# Patient Record
Sex: Male | Born: 1995 | Race: White | Hispanic: No | Marital: Single | Smoking: Current every day smoker
Health system: Southern US, Community
[De-identification: ages and names within clinical notes are randomized; demographics above are authoritative.]

## PROBLEM LIST (undated history)

## (undated) DIAGNOSIS — Z8709 Personal history of other diseases of the respiratory system: Secondary | ICD-10-CM

## (undated) DIAGNOSIS — F32A Depression, unspecified: Secondary | ICD-10-CM

## (undated) DIAGNOSIS — L511 Stevens-Johnson syndrome: Secondary | ICD-10-CM

## (undated) DIAGNOSIS — F419 Anxiety disorder, unspecified: Secondary | ICD-10-CM

## (undated) DIAGNOSIS — F329 Major depressive disorder, single episode, unspecified: Secondary | ICD-10-CM

## (undated) DIAGNOSIS — S83511A Sprain of anterior cruciate ligament of right knee, initial encounter: Secondary | ICD-10-CM

## (undated) DIAGNOSIS — R569 Unspecified convulsions: Secondary | ICD-10-CM

## (undated) HISTORY — PX: WISDOM TOOTH EXTRACTION: SHX21

## (undated) HISTORY — PX: DENTAL SURGERY: SHX609

## (undated) HISTORY — PX: SHOULDER SURGERY: SHX246

---
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
## 2013-05-15 ENCOUNTER — Emergency Department (HOSPITAL_COMMUNITY)
Admission: EM | Admit: 2013-05-15 | Discharge: 2013-05-15 | Disposition: A | Discharge status: Home / Self Care | Payer: BC Managed Care – PPO | Attending: Emergency Medicine | Admitting: Emergency Medicine

## 2013-05-15 ENCOUNTER — Encounter (HOSPITAL_COMMUNITY): Payer: Self-pay | Admitting: Emergency Medicine

## 2013-05-15 DIAGNOSIS — Z79899 Other long term (current) drug therapy: Secondary | ICD-10-CM | POA: Insufficient documentation

## 2013-05-15 DIAGNOSIS — G40909 Epilepsy, unspecified, not intractable, without status epilepticus: Secondary | ICD-10-CM | POA: Insufficient documentation

## 2013-05-15 DIAGNOSIS — F17200 Nicotine dependence, unspecified, uncomplicated: Secondary | ICD-10-CM | POA: Insufficient documentation

## 2013-05-15 DIAGNOSIS — R569 Unspecified convulsions: Secondary | ICD-10-CM

## 2013-05-15 LAB — CBC WITH DIFFERENTIAL/PLATELET
Basophils Relative: 0 % (ref 0–1)
Eosinophils Absolute: 0.4 10*3/uL (ref 0.0–0.7)
Eosinophils Relative: 6 % — ABNORMAL HIGH (ref 0–5)
Hemoglobin: 14.9 g/dL (ref 13.0–17.0)
Lymphs Abs: 2.4 10*3/uL (ref 0.7–4.0)
MCH: 30.9 pg (ref 26.0–34.0)
MCHC: 35.8 g/dL (ref 30.0–36.0)
MCV: 86.3 fL (ref 78.0–100.0)
Monocytes Relative: 12 % (ref 3–12)
Platelets: 206 10*3/uL (ref 150–400)
RBC: 4.82 MIL/uL (ref 4.22–5.81)

## 2013-05-15 LAB — GLUCOSE, CAPILLARY: Glucose-Capillary: 84 mg/dL (ref 70–99)

## 2013-05-15 LAB — POCT I-STAT, CHEM 8
Calcium, Ion: 1.19 mmol/L (ref 1.12–1.23)
Glucose, Bld: 89 mg/dL (ref 70–99)
HCT: 45 % (ref 39.0–52.0)
Hemoglobin: 15.3 g/dL (ref 13.0–17.0)
Potassium: 3.9 mEq/L (ref 3.5–5.1)
TCO2: 25 mmol/L (ref 0–100)

## 2013-05-15 NOTE — ED Provider Notes (Signed)
CSN: [id number]     Arrival date & time 05/15/13  0345 History   First MD Initiated Contact with Patient 05/15/13 0405     Chief Complaint  Patient presents with  . Seizures   (Consider location/radiation/quality/duration/timing/severity/associated sxs/prior Treatment) HPI Is a 17 year old male with a history of seizures, treated with Dilantin by Dr. Anne Hahn locally. He was admitted to [facility] on November 29 after having "60 seizures". I reviewed his admission records indicate that an EEG was performed which was negative. He also had a prolactin level that was within normal range. The differential diagnosis was seizures versus pseudoseizures. He was discharged October 4.   She is here this morning after his girlfriend witnessed a series of seizures lasting about 30 minutes. His seizures usually last about a minute and occur back-to-back. His parents state he had 5 seizures in the car on the way here. There was no incontinence of urine he did not bite his tongue. He complains of pain in his head and in his left shoulder which is common after his seizures. He also has some cramping in his legs. He denies nausea or vomiting.  His parents relate, in private to me, that the seizures he has had in the past have typically been followed by prolonged post ictal states. The seizures he had the occasion to his admission to [facility] had no postictal states and hence the concern that they may represent pseudoseizures. His parents note that he was not admitted to the neurology unit at [facility] but rather to the psychiatry unit after becoming agitated and threatening violence in the ED.  Past Medical History  Diagnosis Date  . Seizures    Past Surgical History  Procedure Laterality Date  . Shoulder surgery    . Wisdom tooth extraction     History reviewed. No pertinent family history. History  Substance Use Topics  . Smoking status: Current Every Day Smoker -- 1.00 packs/day    Types:  Cigarettes  . Smokeless tobacco: Not on file  . Alcohol Use: Yes     Comment: occasionally    Review of Systems  All other systems reviewed and are negative.    Allergies  Biaxin and Sulfa antibiotics  Home Medications   Current Outpatient Rx  Name  Route  Sig  Dispense  Refill  . phenytoin (DILANTIN) 100 MG ER capsule      TAKE 3 CAPSULES BY MOUTH DAILY AT BEDTIME   90 capsule   6     Please Schedule Appt   . phenytoin (DILANTIN) 300 MG ER capsule   Oral   Take 300 mg by mouth at bedtime.          BP 119/77  Pulse 64  Resp 16  SpO2 98%  Physical Exam General: Well-developed, well-nourished male in no acute distress; appearance consistent with age of record HENT: normocephalic; atraumatic Eyes: pupils equal, round and reactive to light; extraocular muscles intact Neck: supple Heart: regular rate and rhythm Lungs: clear to auscultation bilaterally Abdomen: soft; nondistended; nontender; bowel sounds present Extremities: No deformity; full range of motion; pulses normal Neurologic: Awake, alert and oriented; motor function intact in all extremities and symmetric; no facial droop; negative Romberg; normal finger to nose; normal coordination speech Skin: Warm and dry Psychiatric: Flat affect    ED Course  Procedures (including critical care time)    MDM   Nursing notes and vitals signs, including pulse oximetry, reviewed.  Summary of this visit's results, reviewed by myself:  Labs:  Results for orders placed during the hospital encounter of 05/15/13 (from the past 24 hour(s))  POCT I-STAT, CHEM 8     Status: None   Collection Time    05/15/13  4:38 AM      Result Value Range   Sodium 140  135 - 145 mEq/L   Potassium 3.9  3.5 - 5.1 mEq/L   Chloride 103  96 - 112 mEq/L   BUN 12  6 - 23 mg/dL   Creatinine, Ser 4.09  0.50 - 1.35 mg/dL   Glucose, Bld 89  70 - 99 mg/dL   Calcium, Ion 8.11  9.14 - 1.23 mmol/L   TCO2 25  0 - 100 mmol/L   Hemoglobin  15.3  13.0 - 17.0 g/dL   HCT 78.2  95.6 - 21.3 %  PHENYTOIN LEVEL, TOTAL     Status: None   Collection Time    05/15/13  4:40 AM      Result Value Range   Phenytoin Lvl 11.2  10.0 - 20.0 ug/mL  CBC WITH DIFFERENTIAL     Status: Abnormal   Collection Time    05/15/13  4:40 AM      Result Value Range   WBC 6.8  4.0 - 10.5 K/uL   RBC 4.82  4.22 - 5.81 MIL/uL   Hemoglobin 14.9  13.0 - 17.0 g/dL   HCT 08.6  57.8 - 46.9 %   MCV 86.3  78.0 - 100.0 fL   MCH 30.9  26.0 - 34.0 pg   MCHC 35.8  30.0 - 36.0 g/dL   RDW 62.9  52.8 - 41.3 %   Platelets 206  150 - 400 K/uL   Neutrophils Relative % 47  43 - 77 %   Neutro Abs 3.2  1.7 - 7.7 K/uL   Lymphocytes Relative 35  12 - 46 %   Lymphs Abs 2.4  0.7 - 4.0 K/uL   Monocytes Relative 12  3 - 12 %   Monocytes Absolute 0.8  0.1 - 1.0 K/uL   Eosinophils Relative 6 (*) 0 - 5 %   Eosinophils Absolute 0.4  0.0 - 0.7 K/uL   Basophils Relative 0  0 - 1 %   Basophils Absolute 0.0  0.0 - 0.1 K/uL  GLUCOSE, CAPILLARY     Status: None   Collection Time    05/15/13  5:50 AM      Result Value Range   Glucose-Capillary 84  70 - 99 mg/dL   2:44 AM The patient's Dilantin level is therapeutic. He has had no seizure or seizure-like activity in the ED. His parents are comfortable taking him home. They will contact Dr. Anne Hahn' office today. He understands that he is not to be driving or operating machinery.     Hanley Seamen, MD 05/15/13 [phone]

## 2013-05-15 NOTE — ED Notes (Addendum)
Pt family states that he has a hx of seizures. Pt family states that he had 60 seizures this past weekend and was seen at [facility]. Per Family pt had 5 seizures on the car ride over. Pt family states that he gets aggressive with ativan. Pt takes dilatin for seizures. Pt alert and oriented, pt knows dob pt able to assist with getting him in bed. Pt ambulatory.

## 2013-05-17 ENCOUNTER — Telehealth: Payer: Self-pay | Admitting: Neurology

## 2013-05-17 NOTE — Telephone Encounter (Signed)
Brendan Tucker a Child psychotherapist at [employer] that the patient was just discharged from Psychiatric unit at [facility] and needed a follow up appt for his seizures as early as possible, with doctor or NP.  The patient last saw Dr.Willis 04-22-13.  I have left a message for patient to call me so we can get him scheduled.

## 2013-05-23 NOTE — Telephone Encounter (Signed)
I have left 2 messages for patient to call and get appointment scheduled.

## 2013-05-25 NOTE — Telephone Encounter (Signed)
I left another message at [phone], to get him an appointment,  if no response I will contact Child psychotherapist.

## 2013-05-29 NOTE — Telephone Encounter (Signed)
Left message for Social worker that I have called the patient 3 times and have left messages for him to call for an appointment.  I have not heard back from him and I wanted her to be aware.

## 2013-06-20 ENCOUNTER — Ambulatory Visit (INDEPENDENT_AMBULATORY_CARE_PROVIDER_SITE_OTHER): Payer: BC Managed Care – PPO | Admitting: Psychiatry

## 2013-06-20 ENCOUNTER — Encounter (INDEPENDENT_AMBULATORY_CARE_PROVIDER_SITE_OTHER): Payer: Self-pay

## 2013-06-20 ENCOUNTER — Encounter (HOSPITAL_COMMUNITY): Payer: Self-pay | Admitting: Psychiatry

## 2013-06-20 VITALS — BP 111/58 | HR 58 | Ht 75.0 in | Wt 161.0 lb

## 2013-06-20 DIAGNOSIS — F1920 Other psychoactive substance dependence, uncomplicated: Secondary | ICD-10-CM

## 2013-06-20 DIAGNOSIS — F319 Bipolar disorder, unspecified: Secondary | ICD-10-CM

## 2013-06-20 DIAGNOSIS — F1210 Cannabis abuse, uncomplicated: Secondary | ICD-10-CM

## 2013-06-20 DIAGNOSIS — F39 Unspecified mood [affective] disorder: Secondary | ICD-10-CM

## 2013-06-20 MED ORDER — OLANZAPINE 5 MG PO TABS: 5.0000 mg | ORAL_TABLET | Freq: Every day | ORAL | Status: DC
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
## 2013-07-04 ENCOUNTER — Ambulatory Visit (HOSPITAL_COMMUNITY): Payer: Self-pay | Admitting: Psychiatry

## 2013-07-27 ENCOUNTER — Other Ambulatory Visit (HOSPITAL_COMMUNITY): Payer: Self-pay | Admitting: Psychiatry

## 2013-07-31 NOTE — Telephone Encounter (Signed)
Chart reviewed, refill not appropriate at this time. Had appt 11/25 did not show, no future appointments scheduled. Attempted to call patient to notify him that appointment must be made. No answer. Dr Lolly Mustache wil be made aware to approve/deny refill.

## 2013-08-08 NOTE — Telephone Encounter (Signed)
Per MD, no refill authroized Pt has never made follow up appt.

## 2013-09-06 ENCOUNTER — Other Ambulatory Visit: Payer: Self-pay | Admitting: Neurology

## 2014-04-27 ENCOUNTER — Emergency Department (HOSPITAL_COMMUNITY): Payer: BC Managed Care – PPO

## 2014-04-27 ENCOUNTER — Encounter (HOSPITAL_COMMUNITY): Payer: Self-pay | Admitting: Emergency Medicine

## 2014-04-27 ENCOUNTER — Inpatient Hospital Stay (HOSPITAL_COMMUNITY)
Admission: EM | Admit: 2014-04-27 | Discharge: 2014-05-01 | DRG: 158 | Disposition: A | Discharge status: Home / Self Care | Payer: BC Managed Care – PPO | Attending: Internal Medicine | Admitting: Internal Medicine

## 2014-04-27 DIAGNOSIS — T730XXA Starvation, initial encounter: Secondary | ICD-10-CM

## 2014-04-27 DIAGNOSIS — F329 Major depressive disorder, single episode, unspecified: Secondary | ICD-10-CM

## 2014-04-27 DIAGNOSIS — H109 Unspecified conjunctivitis: Secondary | ICD-10-CM

## 2014-04-27 DIAGNOSIS — F17200 Nicotine dependence, unspecified, uncomplicated: Secondary | ICD-10-CM | POA: Diagnosis present

## 2014-04-27 DIAGNOSIS — Z720 Tobacco use: Secondary | ICD-10-CM | POA: Diagnosis present

## 2014-04-27 DIAGNOSIS — E8729 Other acidosis: Secondary | ICD-10-CM

## 2014-04-27 DIAGNOSIS — R1310 Dysphagia, unspecified: Secondary | ICD-10-CM | POA: Diagnosis not present

## 2014-04-27 DIAGNOSIS — F3289 Other specified depressive episodes: Secondary | ICD-10-CM

## 2014-04-27 DIAGNOSIS — F319 Bipolar disorder, unspecified: Secondary | ICD-10-CM | POA: Diagnosis present

## 2014-04-27 DIAGNOSIS — R569 Unspecified convulsions: Secondary | ICD-10-CM | POA: Diagnosis present

## 2014-04-27 DIAGNOSIS — R220 Localized swelling, mass and lump, head: Secondary | ICD-10-CM | POA: Diagnosis not present

## 2014-04-27 DIAGNOSIS — T50905S Adverse effect of unspecified drugs, medicaments and biological substances, sequela: Secondary | ICD-10-CM

## 2014-04-27 DIAGNOSIS — K1230 Oral mucositis (ulcerative), unspecified: Secondary | ICD-10-CM | POA: Diagnosis not present

## 2014-04-27 DIAGNOSIS — Z23 Encounter for immunization: Secondary | ICD-10-CM

## 2014-04-27 DIAGNOSIS — IMO0002 Reserved for concepts with insufficient information to code with codable children: Secondary | ICD-10-CM | POA: Diagnosis not present

## 2014-04-27 DIAGNOSIS — E872 Acidosis, unspecified: Secondary | ICD-10-CM | POA: Diagnosis present

## 2014-04-27 DIAGNOSIS — L511 Stevens-Johnson syndrome: Secondary | ICD-10-CM | POA: Diagnosis present

## 2014-04-27 DIAGNOSIS — R0789 Other chest pain: Secondary | ICD-10-CM

## 2014-04-27 DIAGNOSIS — F4310 Post-traumatic stress disorder, unspecified: Secondary | ICD-10-CM | POA: Diagnosis present

## 2014-04-27 DIAGNOSIS — F1210 Cannabis abuse, uncomplicated: Secondary | ICD-10-CM | POA: Diagnosis present

## 2014-04-27 DIAGNOSIS — T783XXA Angioneurotic edema, initial encounter: Secondary | ICD-10-CM | POA: Diagnosis present

## 2014-04-27 DIAGNOSIS — R05 Cough: Secondary | ICD-10-CM

## 2014-04-27 DIAGNOSIS — R059 Cough, unspecified: Secondary | ICD-10-CM

## 2014-04-27 DIAGNOSIS — F411 Generalized anxiety disorder: Secondary | ICD-10-CM

## 2014-04-27 DIAGNOSIS — K121 Other forms of stomatitis: Principal | ICD-10-CM

## 2014-04-27 DIAGNOSIS — T50905A Adverse effect of unspecified drugs, medicaments and biological substances, initial encounter: Secondary | ICD-10-CM | POA: Diagnosis present

## 2014-04-27 DIAGNOSIS — R221 Localized swelling, mass and lump, neck: Secondary | ICD-10-CM

## 2014-04-27 DIAGNOSIS — J069 Acute upper respiratory infection, unspecified: Secondary | ICD-10-CM

## 2014-04-27 DIAGNOSIS — G40909 Epilepsy, unspecified, not intractable, without status epilepticus: Secondary | ICD-10-CM | POA: Diagnosis present

## 2014-04-27 DIAGNOSIS — Z79899 Other long term (current) drug therapy: Secondary | ICD-10-CM

## 2014-04-27 DIAGNOSIS — T4275XA Adverse effect of unspecified antiepileptic and sedative-hypnotic drugs, initial encounter: Secondary | ICD-10-CM | POA: Diagnosis present

## 2014-04-27 DIAGNOSIS — F1290 Cannabis use, unspecified, uncomplicated: Secondary | ICD-10-CM | POA: Diagnosis present

## 2014-04-27 HISTORY — DX: Personal history of other diseases of the respiratory system: Z87.09

## 2014-04-27 LAB — CBC
HEMATOCRIT: 41.4 % (ref 39.0–52.0)
Hemoglobin: 14 g/dL (ref 13.0–17.0)
MCH: 30.5 pg (ref 26.0–34.0)
MCHC: 33.8 g/dL (ref 30.0–36.0)
MCV: 90.2 fL (ref 78.0–100.0)
Platelets: 247 10*3/uL (ref 150–400)
RBC: 4.59 MIL/uL (ref 4.22–5.81)
RDW: 12.5 % (ref 11.5–15.5)
WBC: 14.6 10*3/uL — AB (ref 4.0–10.5)

## 2014-04-27 LAB — BASIC METABOLIC PANEL
Anion gap: 18 — ABNORMAL HIGH (ref 5–15)
BUN: 8 mg/dL (ref 6–23)
CHLORIDE: 103 meq/L (ref 96–112)
CO2: 19 mEq/L (ref 19–32)
CREATININE: 0.9 mg/dL (ref 0.50–1.35)
Calcium: 9.2 mg/dL (ref 8.4–10.5)
GFR calc Af Amer: >90 mL/min (ref >90)
GFR calc non Af Amer: >90 mL/min (ref >90)
GLUCOSE: 90 mg/dL (ref 70–99)
Potassium: 4.3 mEq/L (ref 3.7–5.3)
Sodium: 140 mEq/L (ref 137–147)

## 2014-04-27 LAB — DIFFERENTIAL
Basophils Absolute: 0 10*3/uL (ref 0.0–0.1)
Basophils Relative: 0 % (ref 0–1)
EOS ABS: 0.3 10*3/uL (ref 0.0–0.7)
Eosinophils Relative: 2 % (ref 0–5)
Lymphocytes Relative: 8 % — ABNORMAL LOW (ref 12–46)
Lymphs Abs: 1.1 10*3/uL (ref 0.7–4.0)
MONO ABS: 1.6 10*3/uL — AB (ref 0.1–1.0)
MONOS PCT: 11 % (ref 3–12)
NEUTROS ABS: 11.7 10*3/uL — AB (ref 1.7–7.7)
Neutrophils Relative %: 79 % — ABNORMAL HIGH (ref 43–77)

## 2014-04-27 LAB — RAPID URINE DRUG SCREEN, HOSP PERFORMED
Amphetamines: NOT DETECTED
BENZODIAZEPINES: NOT DETECTED
Barbiturates: NOT DETECTED
Cocaine: NOT DETECTED
Opiates: NOT DETECTED
Tetrahydrocannabinol: POSITIVE — AB

## 2014-04-27 LAB — HEPATIC FUNCTION PANEL
ALT: 7 U/L (ref 0–53)
AST: 16 U/L (ref 0–37)
Albumin: 3.9 g/dL (ref 3.5–5.2)
Alkaline Phosphatase: 80 U/L (ref 39–117)
Total Bilirubin: 0.7 mg/dL (ref 0.3–1.2)
Total Protein: 6.9 g/dL (ref 6.0–8.3)

## 2014-04-27 LAB — URINALYSIS, ROUTINE W REFLEX MICROSCOPIC
Bilirubin Urine: NEGATIVE
GLUCOSE, UA: NEGATIVE mg/dL
Hgb urine dipstick: NEGATIVE
Ketones, ur: >80 mg/dL — AB
LEUKOCYTES UA: NEGATIVE
Nitrite: NEGATIVE
PH: 6.5 (ref 5.0–8.0)
PROTEIN: NEGATIVE mg/dL
SPECIFIC GRAVITY, URINE: 1.02 (ref 1.005–1.030)
Urobilinogen, UA: 1 mg/dL (ref 0.0–1.0)

## 2014-04-27 LAB — VALPROIC ACID LEVEL: Valproic Acid Lvl: 45.7 ug/mL — ABNORMAL LOW (ref 50.0–100.0)

## 2014-04-27 MED ORDER — METHYLPREDNISOLONE SODIUM SUCC 125 MG IJ SOLR
125.0000 mg | Freq: Once | INTRAMUSCULAR | Status: AC
  Administered 2014-04-27: 125 mg via INTRAVENOUS
  Filled 2014-04-27: qty 2

## 2014-04-27 MED ORDER — PROPARACAINE HCL 0.5 % OP SOLN
1.0000 [drp] | Freq: Once | OPHTHALMIC | Status: AC
  Administered 2014-04-27: 1 [drp] via OPHTHALMIC
  Filled 2014-04-27: qty 15

## 2014-04-27 MED ORDER — POLYVINYL ALCOHOL 1.4 % OP SOLN
1.0000 [drp] | OPHTHALMIC | Status: DC | PRN
  Filled 2014-04-27: qty 15

## 2014-04-27 MED ORDER — LORAZEPAM 2 MG/ML IJ SOLN: 1.0000 mg | Freq: Once | INTRAMUSCULAR | Status: DC

## 2014-04-27 MED ORDER — LIDOCAINE VISCOUS 2 % MT SOLN
15.0000 mL | Freq: Once | OROMUCOSAL | Status: AC
  Administered 2014-04-27: 15 mL via OROMUCOSAL
  Filled 2014-04-27: qty 15

## 2014-04-27 MED ORDER — GUAIFENESIN-DM 100-10 MG/5ML PO SYRP: 5.0000 mL | ORAL_SOLUTION | ORAL | Status: DC | PRN

## 2014-04-27 MED ORDER — ACETAMINOPHEN 500 MG PO TABS
1000.0000 mg | ORAL_TABLET | Freq: Once | ORAL | Status: DC
  Filled 2014-04-27: qty 2

## 2014-04-27 MED ORDER — FAMOTIDINE IN NACL 20-0.9 MG/50ML-% IV SOLN
20.0000 mg | Freq: Two times a day (BID) | INTRAVENOUS | Status: DC
  Administered 2014-04-27 – 2014-04-30 (×6): 20 mg via INTRAVENOUS
  Filled 2014-04-27 (×7): qty 50

## 2014-04-27 MED ORDER — MORPHINE SULFATE 2 MG/ML IJ SOLN
1.0000 mg | INTRAMUSCULAR | Status: DC | PRN
  Administered 2014-04-27 – 2014-04-30 (×9): 1 mg via INTRAVENOUS
  Filled 2014-04-27 (×9): qty 1

## 2014-04-27 MED ORDER — SODIUM CHLORIDE 0.9 % IV BOLUS (SEPSIS)
1000.0000 mL | Freq: Once | INTRAVENOUS | Status: AC
  Administered 2014-04-27: 1000 mL via INTRAVENOUS

## 2014-04-27 MED ORDER — ACETAMINOPHEN 160 MG/5ML PO SOLN: 1000.0000 mg | Freq: Once | ORAL | Status: DC

## 2014-04-27 MED ORDER — POLYVINYL ALCOHOL 1.4 % OP SOLN
1.0000 [drp] | Freq: Every day | OPHTHALMIC | Status: DC
  Administered 2014-04-28 – 2014-05-01 (×4): 1 [drp] via OPHTHALMIC
  Filled 2014-04-27: qty 15

## 2014-04-27 MED ORDER — VALPROATE SODIUM 500 MG/5ML IV SOLN
500.0000 mg | Freq: Two times a day (BID) | INTRAVENOUS | Status: DC
  Administered 2014-04-28 – 2014-04-30 (×6): 500 mg via INTRAVENOUS
  Filled 2014-04-27 (×7): qty 5

## 2014-04-27 MED ORDER — MAGIC MOUTHWASH W/LIDOCAINE
5.0000 mL | Freq: Three times a day (TID) | ORAL | Status: DC | PRN
  Administered 2014-04-28 – 2014-05-01 (×3): 5 mL via ORAL
  Filled 2014-04-27 (×3): qty 5

## 2014-04-27 MED ORDER — FLUORESCEIN SODIUM 1 MG OP STRP
1.0000 | ORAL_STRIP | Freq: Once | OPHTHALMIC | Status: DC
  Filled 2014-04-27: qty 1

## 2014-04-27 MED ORDER — SODIUM CHLORIDE 0.9 % IV SOLN
INTRAVENOUS | Status: DC
  Administered 2014-04-27 (×2): via INTRAVENOUS

## 2014-04-27 MED ORDER — VALPROATE SODIUM 500 MG/5ML IV SOLN: 11.0000 mg | Freq: Two times a day (BID) | INTRAVENOUS | Status: DC

## 2014-04-27 MED ORDER — VALPROATE SODIUM 500 MG/5ML IV SOLN
500.0000 mg | Freq: Once | INTRAVENOUS | Status: AC
  Administered 2014-04-27: 500 mg via INTRAVENOUS
  Filled 2014-04-27: qty 5

## 2014-04-27 MED ORDER — DEXTROSE 5 % IV SOLN
10.0000 mg/kg | Freq: Once | INTRAVENOUS | Status: AC
  Administered 2014-04-27: 670 mg via INTRAVENOUS
  Filled 2014-04-27: qty 13.4

## 2014-04-27 MED ORDER — DIPHENHYDRAMINE HCL 50 MG/ML IJ SOLN
25.0000 mg | Freq: Four times a day (QID) | INTRAMUSCULAR | Status: DC
  Administered 2014-04-27 – 2014-04-30 (×12): 25 mg via INTRAVENOUS
  Filled 2014-04-27 (×3): qty 1
  Filled 2014-04-27 (×2): qty 0.5
  Filled 2014-04-27: qty 1
  Filled 2014-04-27 (×7): qty 0.5
  Filled 2014-04-27: qty 1
  Filled 2014-04-27: qty 0.5
  Filled 2014-04-27 (×2): qty 1
  Filled 2014-04-27 (×2): qty 0.5

## 2014-04-27 MED ORDER — ENOXAPARIN SODIUM 40 MG/0.4ML ~~LOC~~ SOLN
40.0000 mg | SUBCUTANEOUS | Status: DC
  Administered 2014-04-27 – 2014-04-30 (×4): 40 mg via SUBCUTANEOUS
  Filled 2014-04-27 (×7): qty 0.4
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
## 2014-04-27 NOTE — Consult Note (Addendum)
Reason for Consult: Recurrent generalized seizures.  HPI:                                                                                                                                          Brendan Tucker is an 18 y.o. male with history of generalized seizure disorder for 2 years, brought to the emergency room for management of pharyngitis and stomatitis with inability to swallow, including anti-epilepsy medication. After arriving in emergency room patient experienced 2 witnessed generalized seizures. He routinely takes Depakote 1000 mg per day and has recently been started on Lamictal, with the plan to eventually discontinue Depakote. His Depakote level today was slightly low at 45.7. 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  Past Medical History  Diagnosis Date  . Seizures   . History of strep sore throat     Past Surgical History  Procedure Laterality Date  . Shoulder surgery      left  . Wisdom tooth extraction    . Dental surgery      abscess in mouth    Family History  Problem Relation Age of Onset  . Depression Mother   . Bipolar disorder Paternal Uncle   . Cancer      paternal grandmother with cancer base of tongue   . Heart disease      maternal GM  . COPD      maternal GM  . ALS      maternal GF    Social History:  reports that he has been smoking Cigarettes.  He has been smoking about 1.00 pack per day. He does not have any smokeless tobacco history on file. He reports that he drinks alcohol. He reports that he uses illicit drugs (Marijuana).  Allergies  Allergen Reactions  . Ativan [Lorazepam] Other (See Comments)    Causes agitation and aggressive  . Biaxin [Clarithromycin] Nausea And Vomiting  . Sulfa Antibiotics Rash    MEDICATIONS:                                                                                                                      I have reviewed the patient's current medications.   ROS:  History obtained from mother  General ROS: Fever with current presentation Psychological ROS: negative for - behavioral disorder, hallucinations, memory difficulties, mood swings or suicidal ideation Ophthalmic ROS: negative for - blurry vision, double vision, eye pain or loss of vision ENT ROS: As noted in present illness Allergy and Immunology ROS: negative for - hives or itchy/watery eyes Hematological and Lymphatic ROS: negative for - bleeding problems, bruising or swollen lymph nodes Endocrine ROS: negative for - galactorrhea, hair pattern changes, polydipsia/polyuria or temperature intolerance Respiratory ROS: negative for - cough, hemoptysis, shortness of breath or wheezing Cardiovascular ROS: negative for - chest pain, dyspnea on exertion, edema or irregular heartbeat Gastrointestinal ROS: negative for - abdominal pain, diarrhea, hematemesis, nausea/vomiting or stool incontinence Genito-Urinary ROS: negative for - dysuria, hematuria, incontinence or urinary frequency/urgency Musculoskeletal ROS: negative for - joint swelling or muscular weakness Neurological ROS: as noted in HPI Dermatological ROS: negative for rash and skin lesion changes   Blood pressure 111/64, pulse 63, temperature 101.1 F (38.4 C), temperature source Oral, resp. rate 18, height '6\' 3"'  (1.905 m), weight 68 kg (149 lb 14.6 oz), SpO2 97.00%.   Neurologic Examination:                                                                                                      Mental Status: Lethargic, oriented, thought content appropriate.  Speech fluent without evidence of aphasia. Able to follow commands without difficulty. Cranial Nerves: II-Visual fields were  normal. III/IV/VI-Pupils were equal and reacted. Extraocular movements were full and conjugate.    V/VII-no facial numbness and no facial weakness. VIII-normal. X-slightly dysarthric, commensurate with severity of stomatitis and pharyngitis. Motor: 5/5 bilaterally with normal tone and bulk Sensory: Normal throughout. Deep Tendon Reflexes: 2+ and symmetric. Plantars: Flexor bilaterally Cerebellar: Normal finger-to-nose testing.  Lab Results  Component Value Date/Time   CHOL 121 01/08/2011  6:25 AM    Results for orders placed during the hospital encounter of 04/27/14 (from the past 48 hour(s))  CBC     Status: Abnormal   Collection Time    04/27/14 12:08 PM      Result Value Ref Range   WBC 14.6 (*) 4.0 - 10.5 K/uL   RBC 4.59  4.22 - 5.81 MIL/uL   Hemoglobin 14.0  13.0 - 17.0 g/dL   HCT 41.4  39.0 - 52.0 %   MCV 90.2  78.0 - 100.0 fL   MCH 30.5  26.0 - 34.0 pg   MCHC 33.8  30.0 - 36.0 g/dL   RDW 12.5  11.5 - 15.5 %   Platelets 247  150 - 400 K/uL  BASIC METABOLIC PANEL     Status: Abnormal   Collection Time    04/27/14 12:08 PM      Result Value Ref Range   Sodium 140  137 - 147 mEq/L   Potassium 4.3  3.7 - 5.3 mEq/L   Chloride 103  96 - 112 mEq/L   CO2 19  19 - 32 mEq/L   Glucose, Bld 90  70 - 99 mg/dL   BUN 8  6 - 23 mg/dL   Creatinine, Ser 0.90  0.50 - 1.35 mg/dL   Calcium 9.2  8.4 - 10.5 mg/dL   GFR calc non Af Amer >90  >90 mL/min   GFR calc Af Amer >90  >90 mL/min   Comment: (NOTE)     The eGFR has been calculated using the CKD EPI equation.     This calculation has not been validated in all clinical situations.     eGFR's persistently <90 mL/min signify possible Chronic Kidney     Disease.   Anion gap 18 (*) 5 - 15  DIFFERENTIAL     Status: Abnormal   Collection Time    04/27/14 12:08 PM      Result Value Ref Range   Neutrophils Relative % 79 (*) 43 - 77 %   Neutro Abs 11.7 (*) 1.7 - 7.7 K/uL   Lymphocytes Relative 8 (*) 12 - 46 %   Lymphs Abs 1.1  0.7 -  4.0 K/uL   Monocytes Relative 11  3 - 12 %   Monocytes Absolute 1.6 (*) 0.1 - 1.0 K/uL   Eosinophils Relative 2  0 - 5 %   Eosinophils Absolute 0.3  0.0 - 0.7 K/uL   Basophils Relative 0  0 - 1 %   Basophils Absolute 0.0  0.0 - 0.1 K/uL  VALPROIC ACID LEVEL     Status: Abnormal   Collection Time    04/27/14 12:45 PM      Result Value Ref Range   Valproic Acid Lvl 45.7 (*) 50.0 - 100.0 ug/mL  HEPATIC FUNCTION PANEL     Status: None   Collection Time    04/27/14  3:23 PM      Result Value Ref Range   Total Protein 6.9  6.0 - 8.3 g/dL   Albumin 3.9  3.5 - 5.2 g/dL   AST 16  0 - 37 U/L   ALT 7  0 - 53 U/L   Alkaline Phosphatase 80  39 - 117 U/L   Total Bilirubin 0.7  0.3 - 1.2 mg/dL   Bilirubin, Direct <0.2  0.0 - 0.3 mg/dL   Indirect Bilirubin NOT CALCULATED  0.3 - 0.9 mg/dL  URINALYSIS, ROUTINE W REFLEX MICROSCOPIC     Status: Abnormal   Collection Time    04/27/14  5:12 PM      Result Value Ref Range   Color, Urine YELLOW  YELLOW   APPearance CLEAR  CLEAR   Specific Gravity, Urine 1.020  1.005 - 1.030   pH 6.5  5.0 - 8.0   Glucose, UA NEGATIVE  NEGATIVE mg/dL   Hgb urine dipstick NEGATIVE  NEGATIVE   Bilirubin Urine NEGATIVE  NEGATIVE   Ketones, ur >80 (*) NEGATIVE mg/dL   Protein, ur NEGATIVE  NEGATIVE mg/dL   Urobilinogen, UA 1.0  0.0 - 1.0 mg/dL   Nitrite NEGATIVE  NEGATIVE   Leukocytes, UA NEGATIVE  NEGATIVE   Comment: MICROSCOPIC NOT DONE ON URINES WITH NEGATIVE PROTEIN, BLOOD, LEUKOCYTES, NITRITE, OR GLUCOSE <1000 mg/dL.  URINE RAPID DRUG SCREEN (HOSP PERFORMED)     Status: Abnormal   Collection Time    04/27/14  5:12 PM      Result Value Ref Range   Opiates NONE DETECTED  NONE DETECTED   Cocaine NONE DETECTED  NONE DETECTED   Benzodiazepines NONE DETECTED  NONE DETECTED   Amphetamines NONE DETECTED  NONE DETECTED   Tetrahydrocannabinol POSITIVE (*) NONE DETECTED   Barbiturates NONE DETECTED  NONE DETECTED   Comment:            DRUG SCREEN FOR MEDICAL PURPOSES      ONLY.  IF CONFIRMATION IS NEEDED     FOR ANY PURPOSE, NOTIFY LAB     WITHIN 5 DAYS.                LOWEST DETECTABLE LIMITS     FOR URINE DRUG SCREEN     Drug Class       Cutoff (ng/mL)     Amphetamine      1000     Barbiturate      200     Benzodiazepine   774     Tricyclics       128     Opiates          300     Cocaine          300     THC              50    Dg Chest 2 View  04/27/2014   CLINICAL DATA:  Facial cellulitis. One month history of upper respiratory infection.  EXAM: CHEST  2 VIEW  COMPARISON:  No prior chest imaging.  Left shoulder CT 03/14/2011.  FINDINGS: Cardiomediastinal silhouette unremarkable. Lungs clear. Bronchovascular markings normal. Pulmonary vascularity normal. No visible pleural effusions. No pneumothorax. Old left distal clavicular fracture with mild degenerative changes in the left AC joint.  IMPRESSION: No acute cardiopulmonary disease.   Electronically Signed   By: Evangeline Dakin M.D.   On: 04/27/2014 15:38   Assessment/Plan: Recurrent generalized seizures likely secondary to inability to take anti-epilepsy drugs for several days.  Recommendations: 1. Continue Depacon 500 mg every 12 hours. 2. Would favor holding Lamictal at this point. Patient was well controlled with with monotherapy with valproic acid prior to starting Lamictal. 3. No further neurodiagnostic studies are indicated at this point.  We will continue to follow this patient with you.  C.R. Nicole Kindred, MD [facility] [phone]  04/27/2014, 8:11 PM

## 2014-04-27 NOTE — Progress Notes (Addendum)
Received pt. From ED accompanied by mother and sister. Alert and oriented x 4.  No apparent distress noted.  Will endorse to night shift nurse.

## 2014-04-27 NOTE — ED Notes (Signed)
Pt presents with bilateral lip swelling and blisters starting on Tuesday. Pt woke yesterday am with crusting, swelling and drainage from bilateral eyes. Pt states he has not been able to eat or drink Wednesday due to increase pain. Pt also has a hx of epilepsy and has not taken his Depakote and another unknown medication for seizure. Obviously swelling, drainage, and redness from mouth and eyes.

## 2014-04-27 NOTE — H&P (Signed)
Date: 04/27/2014               Patient Name:  Brendan Tucker MRN: [id number]  DOB: 27-May-1996 Age / Sex: 18 y.o., male   PCP: Emergency: Code: Full Brendan Tucker (mother) [phone]              Medical Service: Internal Medicine Teaching Service              Attending Physician: Dr. Debe Coder      Chief Complaint: lip ulcers  History of Present Illness: Brendan Tucker is a 18 yo man with a PMH of seizures and resolved MDD who presents with a 3 day history of oral ulcerations.  He has been experiencing malaise, mild dry cough, and intermittent odynophagia for the last 3 weeks.  On 9/15 he noticed the formation of upper and lower lip blisters that were not initially painful.  On 9/16, these blisters became painful.  On 9/17 he developed odynophagia, sore throat, neck pain, and pain spreading from lips to oral mucosa.  The lips blisters also popped on this day.  This morning he awoke with continuation of the aforementioned symptoms and green purulent drainage from both eyes. Today he has also experienced mild fever, chills, and productive cough with mild chest discomfort associated with cough. Eyes are described as itchy.  He has never had cold sores.    Due to sore throat and odynophagia, he has not been able to take anti-epileptics since Tuesday. He experienced a 30 second seizure in the [facility] ED which resulted in a post-ictal state.  He has been taking Lamictal since July. His neurology plan is to wean depakote and up-titrate lamictal to replace it due to a concern over weight loss.  He denies any other rash, headache, vision changes, chest pain, dyspnea, diarrhea, melena, hematochezia, abdominal pain, dysuria, hematuria.  In the ED he received: valproate 500 mg IV and acyclovir  IV  Meds: Current Facility-Administered Medications  Medication Dose Route Frequency Provider Last Rate Last Dose  . 0.9 %  sodium chloride infusion   Intravenous Continuous Annett Gula, MD  100 mL/hr at 04/27/14 1554    . acetaminophen (TYLENOL) tablet 1,000 mg  1,000 mg Oral Once Arthor Captain, PA-C      . diphenhydrAMINE (BENADRYL) injection 25 mg  25 mg Intravenous Q6H Annett Gula, MD      . famotidine (PEPCID) IVPB 20 mg  20 mg Intravenous Q12H Annett Gula, MD      . fluorescein ophthalmic strip 1 strip  1 strip Both Eyes Once Abigail Harris, PA-C      . guaiFENesin-dextromethorphan (ROBITUSSIN DM) 100-10 MG/5ML syrup 5 mL  5 mL Oral Q4H PRN Annett Gula, MD      . magic mouthwash w/lidocaine  5 mL Oral TID PRN Annett Gula, MD      . methylPREDNISolone sodium succinate (SOLU-MEDROL) 125 mg/2 mL injection 125 mg  125 mg Intravenous Once Annett Gula, MD      . morphine 2 MG/ML injection 1 mg  1 mg Intravenous Q4H PRN Annett Gula, MD       Current Outpatient Prescriptions  Medication Sig Dispense Refill  . diphenhydrAMINE (BENADRYL) 25 MG tablet Take 12.5 mg by mouth at bedtime as needed (for swelling in lips).      . divalproex (DEPAKOTE ER) 500 MG 24 hr tablet Take 1,000 mg by mouth daily.      . LamoTRIgine (  LAMICTAL XR) 25 MG TB24 tablet Take 75 mg by mouth daily.        Allergies: Allergies as of 04/27/2014 - Review Complete 04/27/2014  Allergen Reaction Noted  . Ativan [lorazepam] Other (See Comments) 05/15/2013  . Biaxin [clarithromycin] Nausea And Vomiting 07/05/2012  . Sulfa antibiotics Rash 07/05/2012   Past Medical History  Diagnosis Date  . Seizures    Past Surgical History  Procedure Laterality Date  . Shoulder surgery    . Wisdom tooth extraction     Family History  Problem Relation Age of Onset  . Depression Mother   . Bipolar disorder Paternal Uncle    History   Social History  . Marital Status: Single    Spouse Name: N/A    Number of Children: N/A  . Years of Education: N/A   Occupational History  . Not on file.   Social History Main Topics  . Smoking status: Current Every Day Smoker -- 1.00 packs/day    Types:  Cigarettes  . Smokeless tobacco: Not on file  . Alcohol Use: Yes     Comment: occasionally  . Drug Use: Yes    Special: Marijuana  . Sexual Activity: Not on file   Other Topics Concern  . Not on file   Social History Narrative  . No narrative on file    Review of Systems: Pertinent items are noted in HPI.  Physical Exam: Blood pressure 107/58, pulse 68, temperature 99.6 F (37.6 C), temperature source Oral, resp. rate 22, height  (1.905 m), weight 67.087 kg (147 lb 14.4 oz), SpO2 97.00%. General: Responsive.  Looks sick Eyes: PERRLA.  Conjunctiva red.  Amber eyelash crust noted. Mouth: upper and lower lips with bloody scabs.  Oral and buccal mucosa with ulcerated sloughing appearance. Neck: prominent anterior cervical nodes.  Posterior cervical tenderness CV: RRR, no murmurs, rubs, or gallops Pulm: CTA bilaterally, no wheezes, crackles, or ronchi Abd:  NBS in 4Q.  Non-tender to deep and light palpation Ext: No edema or swelling.  Pulses 2+ bilaterally Skin: dry, no suspicious rashes or lesions.  No genital lesions Neuro: A and A x3. CN grossly intact  Basic Metabolic Panel:  Recent Labs  16/10/96 1208  NA 140  K 4.3  CL 103  CO2 19  GLUCOSE 90  BUN 8  CREATININE 0.90  CALCIUM 9.2   Liver Function Tests:  Recent Labs  04/27/14 1523  AST 16  ALT 7  ALKPHOS 80  BILITOT 0.7  PROT 6.9  ALBUMIN 3.9   No results found for this basename: LIPASE, AMYLASE,  in the last 72 hours No results found for this basename: AMMONIA,  in the last 72 hours CBC:  Recent Labs  04/27/14 1208  WBC 14.6*  NEUTROABS 11.7*  HGB 14.0  HCT 41.4  MCV 90.2  PLT 247     CXR:  Cardiomediastinal silhouette unremarkable. Lungs clear. Bronchovascular markings normal. Pulmonary vascularity normal. No visible pleural effusions. No pneumothorax. Old left distal clavicular fracture with mild degenerative changes in the left AC joint.   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  Full DD also includes infectious (HIV, HSV, HZV, Coxsackie, syphilis), Behcet, drug reaction NPS, angioedema, and allergic.  Airway patent at this time, but concern over potential obstruction due to edema. - s/p NS 1L bolus; NS at 155ml/hr - benadryl  IV  q6hr - famotidine mg IV q12hr - solumedrol 125 mg IV once - acycclovir 670 mg IV once - magic mouthwash PRN - morphine  IV q4h PRN - f/u HSV, RPR, HIV, RVP, UDS  #Conjunctivitis: could be due to the same process responsible for the oral lesions, or could represent a secondary process, most likely infectious. Erythymatous on exam, but not discharge appreciated.  CTM - artificial tears PRN  #Cough: Suspect that 3 weeks of cough due to a distinct viral illness, with possible secondary bacterial infection.  Also could be SJS prodrome.  CXR clear, though WBC elevated today. --robitussin PRN  #Seizure Disorder: Seizure disorder reported to be well controlled with medication, but patient has been unable to take anti-epileptics due to odynophagia, likely resulting in seizure today.  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  Will appreciate neurology results - f/u neuro reqs  #Anion gap: anion gap present with ketones on UA. Most likely represents starvation KA.   This is a Psychologist, occupational Note.  The care of the patient was discussed with Dr. Shirlee Latch and the assessment and plan was formulated with their assistance.  Please see their note for official documentation of the patient encounter.   Signed: Drema Halon, Med Student 04/27/2014, 5:03 PM

## 2014-04-27 NOTE — ED Provider Notes (Signed)
CSN: [id number]     Arrival date & time 04/27/14  1135 History   First MD Initiated Contact with Patient 04/27/14 1206     Chief Complaint  Patient presents with  . Cellulitis     (Consider location/radiation/quality/duration/timing/severity/associated sxs/prior Treatment) HPI  18 year old male with a past medical history of bipolar disorder, PTSD, seizure disorder he presents to the emergency department with complaint of severe blistering of the mouth, eye crusting, cough and chest pain with cough. He is brought in by his mother. The patient has had one month of upper respiratory symptoms. 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 Yesterday when he came in he told his mother that his lips were swelling. He took some Benadryl with some relief however his symptoms became acutely worse last evening. Patient is unable to swallow his own saliva. He complains of severe sore throat pain, severe mouth pain, crusting of the lips. He has never had anything like this before. This morning when his mother went to wake him there was a panel of yellow discharge from his eyes and mouth on his pillow. His pillow was soaked in saliva. Patient has been unable to take his antiseizure medications for the past 4 days. He had a seizure upon arrival lasting approximately 30 seconds with resolution and postictal state. Denies any penile involvement.   Past Medical History  Diagnosis Date  . Seizures    Past Surgical History  Procedure Laterality Date  . Shoulder surgery    . Wisdom tooth extraction     Family History  Problem Relation Age of Onset  . Depression Mother   . Bipolar disorder Paternal Uncle    History  Substance Use Topics  . Smoking status: Current Every Day Smoker -- 1.00 packs/day    Types: Cigarettes  . Smokeless tobacco: Not on file  . Alcohol Use: Yes     Comment: occasionally    Review of Systems   Ten systems reviewed and are negative for acute change, except as noted in  the HPI.   Allergies  Ativan; Biaxin; and Sulfa antibiotics  Home Medications   Prior to Admission medications   Medication Sig Start Date End Date Taking? Authorizing Provider  diphenhydrAMINE (BENADRYL) 25 MG tablet Take 12.5 mg by mouth at bedtime as needed (for swelling in lips).   Yes Historical Provider, MD  divalproex (DEPAKOTE ER) 500 MG 24 hr tablet Take 1,000 mg by mouth daily. 02/28/14  Yes Historical Provider, MD  LamoTRIgine (LAMICTAL XR) 25 MG TB24 tablet Take 75 mg by mouth daily. 02/28/14  Yes Historical Provider, MD   BP 107/65  Pulse 81  Temp(Src) 99.6 F (37.6 C) (Oral)  Resp 16  Ht  (1.905 m)  Wt 147 lb 14.4 oz (67.087 kg)  BMI 18.49 kg/m2  SpO2 99% Physical Exam  Constitutional: He appears well-developed and well-nourished. No distress.  HENT:  Head: Normocephalic.  Mouth/Throat: Uvula is midline.  Crusting and yellow discharge of the lips. Ulcerations involving the tongue, buccal mucosa, and oropharynx.   Eyes: EOM are normal. Pupils are equal, round, and reactive to light. Right eye exhibits discharge. Left eye exhibits discharge. Right conjunctiva is injected. Left conjunctiva is injected.  Discharge and mattering of the lashes. Injected conjunctiva. No fluorescein uptake. No dendritic stains.       ED Course  Procedures (including critical care time) Labs Review Labs Reviewed  CBC - Abnormal; Notable for the following:    WBC 14.6 (*)  All other components within normal limits  BASIC METABOLIC PANEL - Abnormal; Notable for the following:    Anion gap 18 (*)    All other components within normal limits  VALPROIC ACID LEVEL - Abnormal; Notable for the following:    Valproic Acid Lvl 45.7 (*)    All other components within normal limits  DIFFERENTIAL - Abnormal; Notable for the following:    Neutrophils Relative % 79 (*)    Neutro Abs 11.7 (*)    Lymphocytes Relative 8 (*)    Monocytes Absolute 1.6 (*)    All other components within  normal limits  URINALYSIS, ROUTINE W REFLEX MICROSCOPIC  CBC WITH DIFFERENTIAL  URINE RAPID DRUG SCREEN (HOSP PERFORMED)  HERPES SIMPLEX VIRUS(HSV) DNA BY PCR  HSV(HERPES SMPLX)ABS-I+II(IGG+IGM)-BLD  RPR  HIV ANTIBODY (ROUTINE TESTING)  HEPATIC FUNCTION PANEL  LAMOTRIGINE LEVEL    Imaging Review No results found.   EKG Interpretation None      MDM   Final diagnoses:  Stomatitis and mucositis, unspecified  Bilateral conjunctivitis  Cough  Seizure     Patient unable to tolerate PO fluids or his own secretions.  DDX includes herpetic infection, sever aphthous outbreak, Behcets syndrome. Patient admitted by Dr. Desma Maxim. Dr Roseanne Reno will consult. IV depakote/ acyclovir given. depakote level low. cxr negative. Anion gap of 18 , elevated WBC. No signs of herpetic infection of the EYE.   The patient appears reasonably stabilized for admission considering the current resources, flow, and capabilities available in the ED at this time, and I doubt any other [facility] requiring further screening and/or treatment in the ED prior to admission.     Arthor Captain, PA-C 04/27/14 1630

## 2014-04-27 NOTE — H&P (Signed)
Agree with MS 4 note see my note for more details   [place] MD  

## 2014-04-27 NOTE — ED Notes (Signed)
Pt had a second witnessed seizure lasting approximately 15 seconds. Pt maintained O2 sats. Pt confused immediately after and has now returned to baseline A&O X4.

## 2014-04-28 ENCOUNTER — Encounter (HOSPITAL_COMMUNITY): Payer: Self-pay | Admitting: *Deleted

## 2014-04-28 DIAGNOSIS — R569 Unspecified convulsions: Secondary | ICD-10-CM | POA: Diagnosis not present

## 2014-04-28 DIAGNOSIS — E872 Acidosis, unspecified: Secondary | ICD-10-CM | POA: Diagnosis present

## 2014-04-28 DIAGNOSIS — T783XXA Angioneurotic edema, initial encounter: Secondary | ICD-10-CM | POA: Diagnosis present

## 2014-04-28 DIAGNOSIS — T4275XA Adverse effect of unspecified antiepileptic and sedative-hypnotic drugs, initial encounter: Secondary | ICD-10-CM | POA: Diagnosis present

## 2014-04-28 DIAGNOSIS — H109 Unspecified conjunctivitis: Secondary | ICD-10-CM | POA: Diagnosis present

## 2014-04-28 DIAGNOSIS — L511 Stevens-Johnson syndrome: Secondary | ICD-10-CM | POA: Diagnosis present

## 2014-04-28 DIAGNOSIS — K121 Other forms of stomatitis: Secondary | ICD-10-CM | POA: Diagnosis present

## 2014-04-28 DIAGNOSIS — F1210 Cannabis abuse, uncomplicated: Secondary | ICD-10-CM | POA: Diagnosis present

## 2014-04-28 DIAGNOSIS — F411 Generalized anxiety disorder: Secondary | ICD-10-CM | POA: Diagnosis present

## 2014-04-28 DIAGNOSIS — G40909 Epilepsy, unspecified, not intractable, without status epilepticus: Secondary | ICD-10-CM | POA: Diagnosis present

## 2014-04-28 DIAGNOSIS — R059 Cough, unspecified: Secondary | ICD-10-CM | POA: Diagnosis present

## 2014-04-28 DIAGNOSIS — R05 Cough: Secondary | ICD-10-CM | POA: Diagnosis present

## 2014-04-28 DIAGNOSIS — F4310 Post-traumatic stress disorder, unspecified: Secondary | ICD-10-CM | POA: Diagnosis present

## 2014-04-28 DIAGNOSIS — R221 Localized swelling, mass and lump, neck: Secondary | ICD-10-CM | POA: Diagnosis not present

## 2014-04-28 DIAGNOSIS — R609 Edema, unspecified: Secondary | ICD-10-CM | POA: Diagnosis not present

## 2014-04-28 DIAGNOSIS — IMO0002 Reserved for concepts with insufficient information to code with codable children: Secondary | ICD-10-CM | POA: Diagnosis not present

## 2014-04-28 DIAGNOSIS — F319 Bipolar disorder, unspecified: Secondary | ICD-10-CM | POA: Diagnosis present

## 2014-04-28 DIAGNOSIS — T50905S Adverse effect of unspecified drugs, medicaments and biological substances, sequela: Secondary | ICD-10-CM | POA: Diagnosis not present

## 2014-04-28 DIAGNOSIS — J069 Acute upper respiratory infection, unspecified: Secondary | ICD-10-CM | POA: Diagnosis present

## 2014-04-28 DIAGNOSIS — Z79899 Other long term (current) drug therapy: Secondary | ICD-10-CM | POA: Diagnosis not present

## 2014-04-28 DIAGNOSIS — Z23 Encounter for immunization: Secondary | ICD-10-CM | POA: Diagnosis not present

## 2014-04-28 DIAGNOSIS — R1310 Dysphagia, unspecified: Secondary | ICD-10-CM | POA: Diagnosis present

## 2014-04-28 DIAGNOSIS — F17200 Nicotine dependence, unspecified, uncomplicated: Secondary | ICD-10-CM | POA: Diagnosis present

## 2014-04-28 DIAGNOSIS — R220 Localized swelling, mass and lump, head: Secondary | ICD-10-CM | POA: Diagnosis not present

## 2014-04-28 LAB — BASIC METABOLIC PANEL
ANION GAP: 13 (ref 5–15)
BUN: 10 mg/dL (ref 6–23)
CALCIUM: 9.1 mg/dL (ref 8.4–10.5)
CO2: 25 mEq/L (ref 19–32)
CREATININE: 0.84 mg/dL (ref 0.50–1.35)
Chloride: 102 mEq/L (ref 96–112)
Glucose, Bld: 118 mg/dL — ABNORMAL HIGH (ref 70–99)
Potassium: 4.8 mEq/L (ref 3.7–5.3)
Sodium: 140 mEq/L (ref 137–147)

## 2014-04-28 LAB — HEPATIC FUNCTION PANEL
ALBUMIN: 3.5 g/dL (ref 3.5–5.2)
ALK PHOS: 76 U/L (ref 39–117)
ALT: 8 U/L (ref 0–53)
AST: 16 U/L (ref 0–37)
BILIRUBIN TOTAL: 0.5 mg/dL (ref 0.3–1.2)
Total Protein: 6.7 g/dL (ref 6.0–8.3)

## 2014-04-28 LAB — CBC WITH DIFFERENTIAL/PLATELET
Basophils Absolute: 0 10*3/uL (ref 0.0–0.1)
Basophils Relative: 0 % (ref 0–1)
Eosinophils Absolute: 0 10*3/uL (ref 0.0–0.7)
Eosinophils Relative: 0 % (ref 0–5)
HEMATOCRIT: 38.9 % — AB (ref 39.0–52.0)
Hemoglobin: 13.4 g/dL (ref 13.0–17.0)
LYMPHS PCT: 8 % — AB (ref 12–46)
Lymphs Abs: 0.7 10*3/uL (ref 0.7–4.0)
MCH: 30.4 pg (ref 26.0–34.0)
MCHC: 34.4 g/dL (ref 30.0–36.0)
MCV: 88.2 fL (ref 78.0–100.0)
MONO ABS: 0.6 10*3/uL (ref 0.1–1.0)
Monocytes Relative: 6 % (ref 3–12)
Neutro Abs: 7.8 10*3/uL — ABNORMAL HIGH (ref 1.7–7.7)
Neutrophils Relative %: 86 % — ABNORMAL HIGH (ref 43–77)
Platelets: 223 10*3/uL (ref 150–400)
RBC: 4.41 MIL/uL (ref 4.22–5.81)
RDW: 12.5 % (ref 11.5–15.5)
WBC: 9.1 10*3/uL (ref 4.0–10.5)

## 2014-04-28 LAB — RPR

## 2014-04-28 LAB — HIV ANTIBODY (ROUTINE TESTING W REFLEX): HIV: NONREACTIVE

## 2014-04-28 MED ORDER — CETYLPYRIDINIUM CHLORIDE 0.05 % MT LIQD
7.0000 mL | Freq: Two times a day (BID) | OROMUCOSAL | Status: DC
  Administered 2014-04-29 – 2014-04-30 (×3): 7 mL via OROMUCOSAL

## 2014-04-28 MED ORDER — SODIUM CHLORIDE 0.9 % IV SOLN
INTRAVENOUS | Status: AC
  Administered 2014-04-28: 21:00:00 via INTRAVENOUS
  Administered 2014-04-28: 125 mL/h via INTRAVENOUS
  Administered 2014-04-29: 06:00:00 via INTRAVENOUS

## 2014-04-28 MED ORDER — METHYLPREDNISOLONE SODIUM SUCC 125 MG IJ SOLR
80.0000 mg | Freq: Once | INTRAMUSCULAR | Status: AC
  Administered 2014-04-28: 80 mg via INTRAVENOUS
  Filled 2014-04-28: qty 1.28
  Filled 2014-04-28: qty 2

## 2014-04-28 MED ORDER — CHLORHEXIDINE GLUCONATE 0.12 % MT SOLN
15.0000 mL | Freq: Two times a day (BID) | OROMUCOSAL | Status: DC
  Administered 2014-04-28 – 2014-04-30 (×4): 15 mL via OROMUCOSAL
  Filled 2014-04-28 (×5): qty 15

## 2014-04-28 MED ORDER — PNEUMOCOCCAL VAC POLYVALENT 25 MCG/0.5ML IJ INJ
0.5000 mL | INJECTION | INTRAMUSCULAR | Status: AC
  Administered 2014-04-29: 0.5 mL via INTRAMUSCULAR
  Filled 2014-04-28 (×2): qty 0.5

## 2014-04-28 MED ORDER — INFLUENZA VAC SPLIT QUAD 0.5 ML IM SUSY
0.5000 mL | PREFILLED_SYRINGE | INTRAMUSCULAR | Status: AC
  Administered 2014-04-29: 0.5 mL via INTRAMUSCULAR
  Filled 2014-04-28: qty 0.5

## 2014-04-28 NOTE — H&P (Signed)
Pt seen and examined. I agree with findings and plan as outlined. Please refer to my note for further details

## 2014-04-28 NOTE — Progress Notes (Signed)
Subjective: Pt still having pain with swallowing but did drink some milt today.  Denies sob, still +cough.  He states is feels the same as yesterday.  Girlfriend Alexa at bedside and states the swelling appears improved to face.    Objective: Vital signs in last 24 hours: Filed Vitals:   04/27/14 1903 04/27/14 2205 04/28/14 0612 04/28/14 0915  BP: 111/64 109/63 98/59 98/56   Pulse: 63 50 48 103  Temp: 101.1 F (38.4 C) 98.2 F (36.8 C) 97.9 F (36.6 C) 98.1 F (36.7 C)  TempSrc: Oral Oral Oral Oral  Resp: Height:  (1.905 m)     Weight: 149 lb 14.6 oz (68 kg)     SpO2: 97% 99% 100% 98%   Weight change:   Intake/Output Summary (Last 24 hours) at 04/28/14 0936 Last data filed at 04/28/14 1610  Gross per 24 hour  Intake    220 ml  Output      1 ml  Net    219 ml   Vitals reviewed. General: resting in bed, NAD HEENT: [place]/at, yellow crusting to lips with oral ulcerations on hard palate, buccal mucosa, tongue, no scleral icterus, swelling to lips, eyes appears slightly improved.  Cardiac: RRR, no rubs, murmurs or gallops Pulm: clear to auscultation bilaterally, no wheezes, rales, or rhonchi Abd: soft, nontender, nondistended, BS present Ext: warm and well perfused, no pedal edema Neuro: alert and oriented X3, cranial nerves II-XII grossly intact Skin: tattoos to trunk with abrasions to trunk and knee   Lab Results: Basic Metabolic Panel:  Recent Labs Lab 04/27/14 1208 04/28/14 0454  NA 140 140  K 4.3 4.8  CL 103 102  CO2 19 25  GLUCOSE 90 118*  BUN 8 10  CREATININE 0.90 0.84  CALCIUM 9.2 9.1   Liver Function Tests:  Recent Labs Lab 04/27/14 1523  AST 16  ALT 7  ALKPHOS 80  BILITOT 0.7  PROT 6.9  ALBUMIN 3.9   CBC:  Recent Labs Lab 04/27/14 1208 04/28/14 0454  WBC 14.6* 9.1  NEUTROABS 11.7* 7.8*  HGB 14.0 13.4  HCT 41.4 38.9*  MCV 90.2 88.2  PLT 247 223   Urine Drug Screen: Drugs of Abuse     Component Value Date/Time   LABOPIA NONE DETECTED 04/27/2014 1712   COCAINSCRNUR NONE DETECTED 04/27/2014 1712   LABBENZ NONE DETECTED 04/27/2014 1712   AMPHETMU NONE DETECTED 04/27/2014 1712   THCU POSITIVE* 04/27/2014 1712   LABBARB NONE DETECTED 04/27/2014 1712  Urinalysis:  Recent Labs Lab 04/27/14 1712  COLORURINE YELLOW  LABSPEC 1.020  PHURINE 6.5  GLUCOSEU NEGATIVE  HGBUR NEGATIVE  BILIRUBINUR NEGATIVE  KETONESUR >80*  PROTEINUR NEGATIVE  UROBILINOGEN 1.0  NITRITE NEGATIVE  LEUKOCYTESUR NEGATIVE   Misc. Labs: Lamotrigine HSV Coxsackie lfts   Micro Results: No results found for this or any previous visit (from the past 240 hour(s)). Studies/Results: Dg Chest 2 View  04/27/2014   CLINICAL DATA:  Facial cellulitis. One month history of upper respiratory infection.  EXAM: CHEST  2 VIEW  COMPARISON:  No prior chest imaging.  Left shoulder CT 03/14/2011.  FINDINGS: Cardiomediastinal silhouette unremarkable. Lungs clear. Bronchovascular markings normal. Pulmonary vascularity normal. No visible pleural effusions. No pneumothorax. Old left distal clavicular fracture with mild degenerative changes in the left AC joint.  IMPRESSION: No acute cardiopulmonary disease.   Electronically Signed   By: Hulan Saas M.D.   On: 04/27/2014 15:38   Medications: Scheduled Meds: . acetaminophen (  TYLENOL) oral liquid 160 mg/5 mL  1,000 mg Oral Once  . antiseptic oral rinse  7 mL Mouth Rinse q12n4p  . chlorhexidine  15 mL Mouth Rinse BID  . diphenhydrAMINE  25 mg Intravenous Q6H  . enoxaparin (LOVENOX) injection  40 mg Subcutaneous Q24H  . famotidine (PEPCID) IV  20 mg Intravenous Q12H  . [START ON 04/29/2014] Influenza vac split quadrivalent PF  0.5 mL Intramuscular Tomorrow-1000  . methylPREDNISolone (SOLU-MEDROL) injection  80 mg Intravenous Once  . [START ON 04/29/2014] pneumococcal 23 valent vaccine  0.5 mL Intramuscular Tomorrow-1000  . polyvinyl alcohol  1 drop Both Eyes Daily  . valproate sodium  500 mg  Intravenous Q12H   Continuous Infusions: . sodium chloride     PRN Meds:.guaiFENesin-dextromethorphan, magic mouthwash w/lidocaine, morphine injection Assessment/Plan: 18 y.o presenting with worsening facial edema (lips, eyes), odynophagia, blistering to lips and oral cavity. He had had decreased oral intake and unable to take antiepileptics at home and had a seizure in the ED today.   #Stomatitis with facial edema  -Ddx medication reaction like angioedema vs [name]'s related to Lamictal use (recently started 03/06/14 for seizures), will r/o infectious etiology by checking HSV, Coxsackie  - HIV negative, RPR negative -Benadryl 25 mg q6 hours, Pepcid 20 mg bid, Solumedrol 125 mg x 1 and will give another 80 mg x 1 today  -magic mouthwash with lidocaine prn, morphine 1 mg q4 hours prn, Artificial tears for eyes  -will try suction for secretions   #Seizure  -history possible epilepsy (see Care Everywhere notes Dr. Modesto Charon [facility])  -Neurology consulted (Dr. Roseanne Reno) for medication recommendations rec Depacon 500 mg iv bid. Hold Lamictal -seizure precautions  -pending lamotrigine level   #URI symptoms  -will check respiratory virus panel, though URI symptoms could be adverse effect of Lamictal  -prn Robitussin for cough   #MSK chest pain  -chest pain likely 2/2 coughing  -prn morphine, cough syrup   #Likely starvation ketoacidosis  -AG 18 and given UA with ketones, decreased po intake  -will give IVF    #THC/tobacco abuse  -smoking cessation   #Anxiety/Depression history  -Previously neurologist rec. Outpatient f/u.   #F/E/N  -NS 125 cc/hr. Consider adding D5 if po intake continue to be decreased  -full liq diet as tolerated   #DVT  -Lov, scds      Dispo: Disposition is deferred at this time, awaiting improvement of current medical problems.  Anticipated discharge in approximately 1-2 day(s).   The patient does have a current PCP [facility] and does need an  [facility] hospital follow-up appointment after discharge.  The patient does not have transportation limitations that hinder transportation to clinic appointments.  .Services Needed at time of discharge: Y = Yes, Blank = No PT:   OT:   RN:   Equipment:   Other:     LOS: 1 day   Annett Gula, MD [phone] 04/28/2014, 9:36 AM

## 2014-04-28 NOTE — Progress Notes (Signed)
Pt seen and examined with Dr. Shirlee Latch. Please refer to resident note for details.   In brief, pt is a 18 y/o male with PMH of bipolar, PTSD, seizure disorder who presents with blisters in his mouth and on his lips assoc with facial swelling * 4 days. Pt states that he noted his lips were sore 4 days ago and he started developing blisters on his lips. Pt states that the blisters got worse and he also noted the inside of his mouth felt "raw". It was associated with burning pain and he also noted purulent discharge and bleeding. Pt was unable to take his meds at home and had difficulty swallowing liquids. Pt also c/o rhinorrhea * 2-3 weeks assoc with nasal congestion and cough. While in ED he was noted to have a seizure which lasted approx 30 sec and pt was given valproate. Remaining ROS negative  Exam: HEENT: mild swelling of lips, eyes, ulcerations over oral mucosa, lips noted with crusting and bleeding around lips Cardio: RRR, normal heart sounds Lungs : CTA b/l, no stridor Abd: soft, non tender, BS+ Ext: no pedal edema, no lesions over hands/feet  Assessment and Plan:   18 y/o male with ulcerations over lips/oral mucosa- uncertain etiology possibly viral (cocksackie) or allergic reaction likely to lamictal  Oral ulcerations: - f/u HSV, cocksackie titres - c/w benadryl, magic mouthwash, pepcid - Will give 1 more dose of solumedrol today - would attempt vaseline guaze to lips - No stridor, decreased facial swelling. Will monitor  Seizure: - neuro f/u noted. No further w/u for now - Will c/w depacon and avoid lamictal for now  Likely starvation ketosis:  - will monitor. C/w IVF.  - If unable to tolerate liquids will change IVF to D5 1/2 NS - AG now normalized. Will monitor

## 2014-04-28 NOTE — Progress Notes (Signed)
UR completed 

## 2014-04-28 NOTE — Progress Notes (Signed)
Subjective: No recurrence of seizure activity reported. No adverse events reported to light.  Objective: Current vital signs: BP 94/60  Pulse 60  Temp(Src) 98.2 F (36.8 C) (Oral)  Resp 18  Ht  (1.905 m)  Wt 68 kg (149 lb 14.6 oz)  BMI 18.74 kg/m2  SpO2 98%  Neurologic Exam: Alert and in no acute distress. Patient was well oriented to time as well as place. Mental status was normal. Speech was slightly dysarthric, commensurate with severity of pharyngitis and stomatitis. Patient moved extremities equally with no signs of focal weakness.  Medications: I have reviewed the patient's current medications.  Assessment/Plan: 18 year old who is being evaluated for management of recurrent seizures, which likely occurred due to patient being unable to swallow routine medications, including anti-epilepsy medication. He is currently on valproic acid 500 mg twice a day which reportedly controlled his seizures well as monotherapy, prior to consideration to switching him to Lamictal. Recommend continuing to hold Lamictal.  I will plan to see this patient in followup on an as needed basis following this visit.  C.R. Roseanne Reno, MD [facility] [phone]  04/28/2014  4:36 PM

## 2014-04-28 NOTE — ED Provider Notes (Signed)
18 y.o. Male with complaints of orals mucosa lesions and lip lesions who has been unable to take po medications including his seizure meds.  PE WDWN with mildly elevated temp at 99.6 a Ulcerations of oral mucosa and yellow crusting at lips. Lungs- cta Abdomen soft and nontender   I performed a history and physical examination of Brendan Tucker and discussed his management with Ms. Harris.  I agree with the history, physical, assessment, and plan of care, with the following exceptions: None  I was present for the following procedures: None Time Spent in Critical Care of the patient: None Time spent in discussions with the patient and family: 5  Brendan Tucker    Hilario Quarry, MD 04/28/14 1000

## 2014-04-29 DIAGNOSIS — T50905A Adverse effect of unspecified drugs, medicaments and biological substances, initial encounter: Secondary | ICD-10-CM | POA: Diagnosis present

## 2014-04-29 MED ORDER — WHITE PETROLATUM GEL
Status: AC
  Administered 2014-04-29: 0.2
  Filled 2014-04-29: qty 5

## 2014-04-29 NOTE — Progress Notes (Signed)
Subjective: Pt feeling a little better today still hurts to talk due to mouth ulcerations.  Denies sob.  He is able to eat jello and drink milk.  Mother at bedside.    Objective: Vital signs in last 24 hours: Filed Vitals:   04/28/14 0915 04/28/14 1415 04/28/14 2219 04/29/14 0611  BP: 98/56 94/60 100/55 102/60  Pulse: 103 60 43 52  Temp: 98.1 F (36.7 C) 98.2 F (36.8 C) 97.6 F (36.4 C) 98.1 F (36.7 C)  TempSrc: Oral Oral Oral Oral  Resp: Height:      Weight:      SpO2: 98% 98% 99% 99%   Weight change:   Intake/Output Summary (Last 24 hours) at 04/29/14 0802 Last data filed at 04/29/14 0612  Gross per 24 hour  Intake   2780 ml  Output    900 ml  Net   1880 ml   Vitals reviewed. General: resting in bed, NAD HEENT: [place]/at, dried bloody crusting to lips with oral ulcerations on hard palate, buccal mucosa, tongue, no scleral icterus, swelling to lips improved, eyelid swelling appears slightly to moderately improved.  Cardiac: RRR, no rubs, murmurs or gallops Pulm: clear to auscultation bilaterally, no wheezes, rales, or rhonchi Abd: soft, nontender, nondistended, BS present Ext: warm and well perfused, no pedal edema Neuro: alert and oriented X3, cranial nerves II-XII grossly intact Skin: tattoos to trunk with abrasions to trunk and knee   Lab Results: Basic Metabolic Panel:  Recent Labs Lab 04/27/14 1208 04/28/14 0454  NA 140 140  K 4.3 4.8  CL 103 102  CO2 19 25  GLUCOSE 90 118*  BUN 8 10  CREATININE 0.90 0.84  CALCIUM 9.2 9.1   Liver Function Tests:  Recent Labs Lab 04/27/14 1523 04/28/14 0454  AST 16 16  ALT 7 8  ALKPHOS 80 76  BILITOT 0.7 0.5  PROT 6.9 6.7  ALBUMIN 3.9 3.5   CBC:  Recent Labs Lab 04/27/14 1208 04/28/14 0454  WBC 14.6* 9.1  NEUTROABS 11.7* 7.8*  HGB 14.0 13.4  HCT 41.4 38.9*  MCV 90.2 88.2  PLT 247 223   Urine Drug Screen: Drugs of Abuse     Component Value Date/Time   LABOPIA NONE DETECTED  04/27/2014 1712   COCAINSCRNUR NONE DETECTED 04/27/2014 1712   LABBENZ NONE DETECTED 04/27/2014 1712   AMPHETMU NONE DETECTED 04/27/2014 1712   THCU POSITIVE* 04/27/2014 1712   LABBARB NONE DETECTED 04/27/2014 1712  Urinalysis:  Recent Labs Lab 04/27/14 1712  COLORURINE YELLOW  LABSPEC 1.020  PHURINE 6.5  GLUCOSEU NEGATIVE  HGBUR NEGATIVE  BILIRUBINUR NEGATIVE  KETONESUR >80*  PROTEINUR NEGATIVE  UROBILINOGEN 1.0  NITRITE NEGATIVE  LEUKOCYTESUR NEGATIVE   Misc. Labs: Lamotrigine HSV Coxsackie  Micro Results: No results found for this or any previous visit (from the past 240 hour(s)). Studies/Results: Dg Chest 2 View  04/27/2014   CLINICAL DATA:  Facial cellulitis. One month history of upper respiratory infection.  EXAM: CHEST  2 VIEW  COMPARISON:  No prior chest imaging.  Left shoulder CT 03/14/2011.  FINDINGS: Cardiomediastinal silhouette unremarkable. Lungs clear. Bronchovascular markings normal. Pulmonary vascularity normal. No visible pleural effusions. No pneumothorax. Old left distal clavicular fracture with mild degenerative changes in the left AC joint.  IMPRESSION: No acute cardiopulmonary disease.   Electronically Signed   By: Hulan Saas M.D.   On: 04/27/2014 15:38   Medications: Scheduled Meds: . acetaminophen (TYLENOL) oral liquid 160 mg/5 mL  1,000  mg Oral Once  . antiseptic oral rinse  7 mL Mouth Rinse q12n4p  . chlorhexidine  15 mL Mouth Rinse BID  . diphenhydrAMINE  25 mg Intravenous Q6H  . enoxaparin (LOVENOX) injection  40 mg Subcutaneous Q24H  . famotidine (PEPCID) IV  20 mg Intravenous Q12H  . Influenza vac split quadrivalent PF  0.5 mL Intramuscular Tomorrow-1000  . pneumococcal 23 valent vaccine  0.5 mL Intramuscular Tomorrow-1000  . polyvinyl alcohol  1 drop Both Eyes Daily  . valproate sodium  500 mg Intravenous Q12H   Continuous Infusions: . sodium chloride 125 mL/hr at 04/29/14 0536   PRN Meds:.guaiFENesin-dextromethorphan, magic mouthwash  w/lidocaine, morphine injection Assessment/Plan: 18 y.o presenting with worsening facial edema (lips, eyes), odynophagia, blistering to lips and oral cavity. He had had decreased oral intake and unable to take antiepileptics at home and had a seizure in the ED today.   #Stomatitis with facial edema  -Ddx medication reaction like angioedema vs [name]'s related to Lamictal use (recently started 03/06/14 for seizures), will r/o infectious etiology by checking HSV, Coxsackie  -Benadryl 25 mg q6 hours, Pepcid 20 mg bid -magic mouthwash with lidocaine prn, morphine 1 mg q4 hours prn, Artificial tears for eyes  -if better tomorrow consider d/c if able to take oral intake and pills  #Seizure  -history possible epilepsy (see Care Everywhere notes Dr. Modesto Charon [facility])  -Neurology consulted (Dr. Roseanne Reno) for medication recommendations rec Depacon 500 mg iv bid. Hold Lamictal and will not resume -seizure precautions  -pending lamotrigine level   #URI symptoms  -will check respiratory virus panel, though URI symptoms could be adverse effect of Lamictal  -prn Robitussin for cough   #MSK chest pain  -chest pain likely 2/2 coughing  -prn morphine, cough syrup   #THC/tobacco abuse  -smoking cessation   #Anxiety/Depression history  -Previously neurologist rec. Outpatient f/u.   #F/E/N  -NS 125 cc/hr -full liq diet as tolerated   #DVT  -Lov, scds    Dispo: Disposition is deferred at this time, awaiting improvement of current medical problems.  Anticipated discharge in approximately 1-2 day(s).   The patient does have a current PCP [facility] and does need an [facility] hospital follow-up appointment after discharge.  The patient does not have transportation limitations that hinder transportation to clinic appointments.  .Services Needed at time of discharge: Y = Yes, Blank = No PT:   OT:   RN:   Equipment:   Other:     LOS: 2 days   Annett Gula, MD [phone] 04/29/2014, 8:02  AM

## 2014-04-29 NOTE — Progress Notes (Signed)
Case d/w Dr. Shirlee Latch. I agree with plan as outlined in note.  F/u cocksackie titres, resp panel

## 2014-04-30 DIAGNOSIS — R609 Edema, unspecified: Secondary | ICD-10-CM

## 2014-04-30 LAB — RESPIRATORY VIRUS PANEL
Adenovirus: NOT DETECTED
Influenza A H1: NOT DETECTED
Influenza A H3: NOT DETECTED
Influenza A: NOT DETECTED
Influenza B: NOT DETECTED
Metapneumovirus: NOT DETECTED
Parainfluenza 1: NOT DETECTED
Parainfluenza 2: NOT DETECTED
Parainfluenza 3: NOT DETECTED
Respiratory Syncytial Virus A: NOT DETECTED
Respiratory Syncytial Virus B: NOT DETECTED
Rhinovirus: NOT DETECTED

## 2014-04-30 LAB — HSV(HERPES SMPLX)ABS-I+II(IGG+IGM)-BLD
HERPES SIMPLEX VRS I-IGM AB (EIA): 0.22 {index}
HSV 2 Glycoprotein G Ab, IgG: <0.1 IV

## 2014-04-30 LAB — HERPES SIMPLEX VIRUS(HSV) DNA BY PCR
HSV 1 DNA: NOT DETECTED
HSV 2 DNA: NOT DETECTED

## 2014-04-30 MED ORDER — DIPHENHYDRAMINE HCL 25 MG PO CAPS: 25.0000 mg | ORAL_CAPSULE | Freq: Three times a day (TID) | ORAL | Status: DC | PRN

## 2014-04-30 MED ORDER — HYDROCODONE-ACETAMINOPHEN 5-325 MG PO TABS
1.0000 | ORAL_TABLET | Freq: Four times a day (QID) | ORAL | Status: DC | PRN
  Administered 2014-04-30 – 2014-05-01 (×2): 2 via ORAL
  Filled 2014-04-30 (×2): qty 2

## 2014-04-30 MED ORDER — FAMOTIDINE 20 MG PO TABS
20.0000 mg | ORAL_TABLET | Freq: Two times a day (BID) | ORAL | Status: DC
  Administered 2014-04-30 – 2014-05-01 (×3): 20 mg via ORAL
  Filled 2014-04-30 (×4): qty 1

## 2014-04-30 MED ORDER — DIVALPROEX SODIUM ER 500 MG PO TB24
1000.0000 mg | ORAL_TABLET | Freq: Every day | ORAL | Status: DC
  Administered 2014-04-30: 1000 mg via ORAL
  Filled 2014-04-30: qty 2

## 2014-04-30 MED ORDER — DIVALPROEX SODIUM ER 500 MG PO TB24
1000.0000 mg | ORAL_TABLET | Freq: Every day | ORAL | Status: DC
  Administered 2014-04-30: 1000 mg via ORAL
  Filled 2014-04-30 (×2): qty 2

## 2014-04-30 NOTE — Progress Notes (Signed)
I have seen the patient and reviewed the daily progress note by Daniel Karb MS IV and discussed the care of the patient with them.  Please see note for my findings, assessment, and plans/additions.   Rydell Wiegel, MD   

## 2014-04-30 NOTE — Progress Notes (Signed)
Subjective: Patient says he feels much better than admission day.  Still experiencing oral pain, but improved today.  He is advancing diet and wishes to eat mashed potatoes.  He feels that he will be able to swallow oral medications. He would like to go home.  Objective: Vital signs in last 24 hours: Filed Vitals:   04/29/14 0611 04/29/14 1410 04/29/14 2247 04/30/14 0554  BP: 102/60 109/64 110/71 104/69  Pulse: 52 65 59 62  Temp: 98.1 F (36.7 C) 98.1 F (36.7 C) 98.2 F (36.8 C) 97.5 F (36.4 C)  TempSrc: Oral Oral Oral   Resp: Height:      Weight:      SpO2: 99% 98% 99% 98%   Weight change:   Intake/Output Summary (Last 24 hours) at 04/30/14 1456 Last data filed at 04/29/14 1851  Gross per 24 hour  Intake    240 ml  Output      0 ml  Net    240 ml   Vitals reviewed. General: resting in bed, NAD HEENT: [place]/at, dried bloody crusting to lips with oral ulcerations on hard palate, buccal mucosa, tongue, no scleral icterus, swelling to lips improved, eyelid swelling appears slightly to moderately improved.  Cardiac: RRR, no rubs, murmurs or gallops Pulm: clear to auscultation bilaterally, no wheezes, rales, or rhonchi Abd: soft, nontender, nondistended, BS present Ext: warm and well perfused, no pedal edema Neuro: alert and oriented X3, cranial nerves II-XII grossly intact Skin: tattoos to trunk with abrasions to trunk and knee   Lab Results: Basic Metabolic Panel:  Recent Labs Lab 04/27/14 1208 04/28/14 0454  NA 140 140  K 4.3 4.8  CL 103 102  CO2 19 25  GLUCOSE 90 118*  BUN 8 10  CREATININE 0.90 0.84  CALCIUM 9.2 9.1   Liver Function Tests:  Recent Labs Lab 04/27/14 1523 04/28/14 0454  AST 16 16  ALT 7 8  ALKPHOS 80 76  BILITOT 0.7 0.5  PROT 6.9 6.7  ALBUMIN 3.9 3.5   CBC:  Recent Labs Lab 04/27/14 1208 04/28/14 0454  WBC 14.6* 9.1  NEUTROABS 11.7* 7.8*  HGB 14.0 13.4  HCT 41.4 38.9*  MCV 90.2 88.2  PLT 247 223   Urine  Drug Screen: Drugs of Abuse     Component Value Date/Time   LABOPIA NONE DETECTED 04/27/2014 1712   COCAINSCRNUR NONE DETECTED 04/27/2014 1712   LABBENZ NONE DETECTED 04/27/2014 1712   AMPHETMU NONE DETECTED 04/27/2014 1712   THCU POSITIVE* 04/27/2014 1712   LABBARB NONE DETECTED 04/27/2014 1712  Urinalysis:  Recent Labs Lab 04/27/14 1712  COLORURINE YELLOW  LABSPEC 1.020  PHURINE 6.5  GLUCOSEU NEGATIVE  HGBUR NEGATIVE  BILIRUBINUR NEGATIVE  KETONESUR >80*  PROTEINUR NEGATIVE  UROBILINOGEN 1.0  NITRITE NEGATIVE  LEUKOCYTESUR NEGATIVE   Misc. Labs: Lamotrigine HSV Coxsackie  Micro Results: No results found for this or any previous visit (from the past 240 hour(s)). Studies/Results: No results found. Medications: Scheduled Meds: . acetaminophen (TYLENOL) oral liquid 160 mg/5 mL  1,000 mg Oral Once  . antiseptic oral rinse  7 mL Mouth Rinse q12n4p  . chlorhexidine  15 mL Mouth Rinse BID  . diphenhydrAMINE  25 mg Intravenous Q6H  . divalproex  1,000 mg Oral Daily  . enoxaparin (LOVENOX) injection  40 mg Subcutaneous Q24H  . famotidine (PEPCID) IV  20 mg Intravenous Q12H  . polyvinyl alcohol  1 drop Both Eyes Daily   Continuous Infusions:  PRN Meds:.guaiFENesin-dextromethorphan, magic mouthwash w/lidocaine, morphine injection Assessment/Plan: 18 y.o presenting with worsening facial edema (lips, eyes), odynophagia, blistering to lips and oral cavity. Seizure x2 in the ED due to inability to take PO medications.   #Stomatitis with facial edema  -Ddx medication reaction like angioedema vs [name]'s related to Lamictal use (recently started 03/06/14 for seizures), will r/o infectious etiology by checking HSV, Coxsackie -Benadryl 25 mg q6 hours, Pepcid 20 mg bid -magic mouthwash with lidocaine prn, morphine 1 mg q4 hours prn, Artificial tears for eyes  - Condition improved; If patient can tolerate oral medications tonight, he will be safe for DC tomorrow  #Seizure    -history possible epilepsy (see Care Everywhere notes Dr. Modesto Charon [facility])  -Neurology consulted (Dr. Roseanne Reno) for medication recommendations rec Depacon 500 mg iv bid. Hold Lamictal and will not resume -seizure precautions  -pending lamotrigine level -patient will be discharged on valproic acid  po bid; he will follow-up with Dr. Modesto Charon at [facility]   #URI symptoms  -will check respiratory virus panel, though URI symptoms could be adverse effect of Lamictal  -prn Robitussin for cough   #MSK chest pain  -chest pain likely 2/2 coughing  -prn morphine, cough syrup   #THC/tobacco abuse  -smoking cessation   #Anxiety/Depression history  -Previously neurologist rec. Outpatient f/u.   #F/E/N  -NS 125 cc/hr -full liq diet as tolerated   #DVT  -Lov, scds    Dispo: Disposition is deferred at this time, awaiting improvement of current medical problems.  Anticipated discharge in approximately 1-2 day(s).   The patient does have a current PCP [facility] and does need an [facility] hospital follow-up appointment after discharge.  The patient does not have transportation limitations that hinder transportation to clinic appointments.  .Services Needed at time of discharge: Y = Yes, Blank = No PT:   OT:   RN:   Equipment:   Other:     LOS: 3 days   Drema Halon, Med Student [phone] 04/30/2014, 2:56 PM

## 2014-04-30 NOTE — Progress Notes (Signed)
  Date: 04/30/2014  Patient name: Brendan Tucker  Medical record number: [id number]  Date of birth: 08/31/1995   This patient has been seen and the plan of care was discussed with the house staff. Please see their note for complete details. I concur with their findings.  Inez Catalina, MD 04/30/2014, 8:55 PM

## 2014-04-30 NOTE — Progress Notes (Signed)
Brendan Tucker WUJ:[id number] DOB: 01/14/1996 DOA: 04/27/2014 PCP: No primary provider on file. Assessment/ Plan:    Brendan Tucker is a 18 yo man with a PMH of seizures and resolved MDD who presents with a 3 day history of oral ulcerations.  Stomatitis with facial edema  -Ddx medication reaction like angioedema vs Brendan Tucker's related to Lamictal use (recently started 03/06/14 for seizures), will r/o infectious etiology. Still has problems swallowing but would like to try.  Plan  -Checking HSV, Coxsackie >> pending - Switch his medication to PO and see how he tolerates them  - Benadryl 25 mg q6 hours, Pepcid 20 mg bid  - start Vicodin PO for pain  - start soft diet today - likely d/c home tomorrow  Seizure: history possible epilepsy (see Care Everywhere notes Brendan Tucker)  -Neurology consulted (Brendan Tucker) for medication recommendations rec Depacon 500 mg iv bid. Hold Lamictal and will not resume  Plan  -seizure precautions  -pending lamotrigine level  -Switch Depakote IV to PO. If he tolerates, he will discharged tomorrow   F/E/N: Soft diet   VTE Ppx: Heparin   CODE STATUS: Full   Disposition: Will be discharged tomorrow if he does well overnight.   The patient does have current PCP (No primary provider on file.), therefore will be require OPC follow-up after discharge.   The patient does not have transportation limitations that hinder transportation to clinic appointments.  .Services Needed at time of discharge: Y = Yes, Blank = No PT:   OT:   RN:   Equipment:   Other:    Length of Stay: 3 days   Subjective/Interval Events:    Subjective:  Feeling better. Able to shallow better.  Interval Events: Tolerating PO intake with less pain on swallowing   Objective:     Last BM Date: 04/28/14   Weights: 24-hour Weight change:   Filed Weights   04/27/14 1149 04/27/14 1903  Weight: 147 lb 14.4 oz (67.087 kg) 149 lb 14.6 oz (68 kg)      Intake/Output:   Intake/Output Summary (Last 24 hours) at 04/30/14 1710 Last data filed at 04/29/14 1851  Gross per 24 hour  Intake    240 ml  Output      0 ml  Net    240 ml       Physical Exam: Vital Signs:   Temp:  [97.5 F (36.4 C)-98.4 F (36.9 C)] 98.4 F (36.9 C) (09/21 1415) Pulse Rate:  [59-68] 68 (09/21 1415) Resp:  [16] 16 (09/21 1415) BP: (101-110)/(51-71) 101/51 mmHg (09/21 1415) SpO2:  [97 %-99 %] 97 % (09/21 1415) General: Vital signs reviewed. No distress. Mother in room.  HEENT: dried bloody crusting to lips with oral ulcerations and swelling to lips improved, eyelid swelling appears improved Lungs: Clear to auscultation bilaterally  Heart: RRR; no extra sounds or murmurs  Abdomen: Bowel sounds present, soft, nontender; no hepatosplenomegaly  Extremities: No bilateral ankle edema  Neurologic: Alert and oriented x3. Moves all extremities  Labs: Basic Metabolic Panel:  Recent Labs Lab 04/27/14 1208 04/28/14 0454  NA 140 140  K 4.3 4.8  CL 103 102  CO2 19 25  GLUCOSE 90 118*  BUN 8 10  CREATININE 0.90 0.84  CALCIUM 9.2 9.1    Liver Function Tests:  Recent Labs Lab 04/27/14 1523 04/28/14 0454  AST 16 16  ALT 7 8  ALKPHOS 80 76  BILITOT 0.7 0.5  PROT 6.9 6.7  ALBUMIN 3.9  3.5    CBC:  Recent Labs Lab 04/27/14 1208 04/28/14 0454  WBC 14.6* 9.1  NEUTROABS 11.7* 7.8*  HGB 14.0 13.4  HCT 41.4 38.9*  MCV 90.2 88.2  PLT 247 223    Cardiac Enzymes: No results found for this basename: CKTOTAL, CKMB, CKMBINDEX, TROPONINI,  in the last 168 hours   CBG: No results found for this basename: GLUCAP,  in the last 168 hours  Coagulation Studies: No results found for this basename: LABPROT, INR,  in the last 72 hours  Microbiology: No results found for this or any previous visit.   Imaging: No results found.    Medications:    Infusions:     Scheduled Medications: . acetaminophen (TYLENOL) oral liquid 160 mg/5 mL   1,000 mg Oral Once  . antiseptic oral rinse  7 mL Mouth Rinse q12n4p  . chlorhexidine  15 mL Mouth Rinse BID  . divalproex  1,000 mg Oral QHS  . enoxaparin (LOVENOX) injection  40 mg Subcutaneous Q24H  . famotidine  20 mg Oral BID  . polyvinyl alcohol  1 drop Both Eyes Daily     PRN Medications: diphenhydrAMINE, guaiFENesin-dextromethorphan, HYDROcodone-acetaminophen, magic mouthwash w/lidocaine  Principal Problem:   Stomatitis Active Problems:   Seizure   Starvation ketoacidosis   Musculoskeletal chest pain   Tobacco abuse   Marijuana use   Medication reaction    Signed by:  Brendan Adolph, MD PGY-3, Internal Medicine  Pager [phone] 04/30/2014, 5:10 PM

## 2014-05-01 ENCOUNTER — Encounter: Payer: Self-pay | Admitting: Internal Medicine

## 2014-05-01 DIAGNOSIS — F1210 Cannabis abuse, uncomplicated: Secondary | ICD-10-CM

## 2014-05-01 DIAGNOSIS — T50905S Adverse effect of unspecified drugs, medicaments and biological substances, sequela: Secondary | ICD-10-CM

## 2014-05-01 LAB — LAMOTRIGINE LEVEL: Lamotrigine Lvl: 1.6 ug/mL — ABNORMAL LOW (ref 4.0–18.0)

## 2014-05-01 MED ORDER — HYDROCODONE-ACETAMINOPHEN 5-325 MG PO TABS: 1.0000 | ORAL_TABLET | Freq: Four times a day (QID) | ORAL | Status: DC | PRN

## 2014-05-01 MED ORDER — MAGIC MOUTHWASH W/LIDOCAINE: 5.0000 mL | Freq: Three times a day (TID) | ORAL | Status: DC | PRN
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
## 2014-05-01 NOTE — Progress Notes (Signed)
  Date: 02-08-2014  Patient name: Brendan Tucker  Medical record number: [id number]  Date of birth: 09-Aug-1996   This patient has been seen and the plan of care was discussed with the house staff. Please see their note for complete details. I concur with their findings with the following additions/corrections:  Improved, HSV negative.  He will likely continue to improve and can be discharged today with appropriate plan for follow up.   Inez Catalina, MD 02-08-2014, 2:00 PM

## 2014-05-01 NOTE — Progress Notes (Signed)
Subjective: Pt doing better tolerating po and oral meds.   Objective: Vital signs in last 24 hours: Filed Vitals:   04/30/14 0554 04/30/14 1415 04/30/14 2210 05/01/14 0528  BP: 104/69 101/51 98/61 114/68  Pulse: 62 68 54 49  Temp: 97.5 F (36.4 C) 98.4 F (36.9 C) 97.9 F (36.6 C) 97.5 F (36.4 C)  TempSrc:  Oral Oral Oral  Resp: _0 Height:      Weight:      SpO2: 98% 97% 99% 99%   Weight change:   Intake/Output Summary (Last 24 hours) at 05/01/14 1055 Last data filed at 04/30/14 1400  Gross per 24 hour  Intake    240 ml  Output      0 ml  Net    240 ml   Vitals reviewed. General: resting in bed, NAD HEENT: [place]/at, dried bloody crusting to lips with oral ulcerations on hard palate, buccal mucosa, tongue, no scleral icterus Cardiac: RRR, no rubs, murmurs or gallops Pulm: clear to auscultation bilaterally, no wheezes, rales, or rhonchi Abd: soft, nontender, nondistended, BS present Ext: warm and well perfused, no pedal edema Neuro: alert and oriented X3, cranial nerves II-XII grossly intact Skin: tattoos to trunk with abrasions to trunk and knee   Lab Results: Basic Metabolic Panel:  Recent Labs Lab 04/27/14 1208 04/28/14 0454  NA 140 140  K 4.3 4.8  CL 103 102  CO2 19 25  GLUCOSE 90 118*  BUN 8 10  CREATININE 0.90 0.84  CALCIUM 9.2 9.1   Liver Function Tests:  Recent Labs Lab 04/27/14 1523 04/28/14 0454  AST 16 16  ALT 7 8  ALKPHOS 80 76  BILITOT 0.7 0.5  PROT 6.9 6.7  ALBUMIN 3.9 3.5   CBC:  Recent Labs Lab 04/27/14 1208 04/28/14 0454  WBC 14.6* 9.1  NEUTROABS 11.7* 7.8*  HGB 14.0 13.4  HCT 41.4 38.9*  MCV 90.2 88.2  PLT 247 223   Urine Drug Screen: Drugs of Abuse     Component Value Date/Time   LABOPIA NONE DETECTED 04/27/2014 1712   COCAINSCRNUR NONE DETECTED 04/27/2014 1712   LABBENZ NONE DETECTED 04/27/2014 1712   AMPHETMU NONE DETECTED 04/27/2014 1712   THCU POSITIVE* 04/27/2014 1712   LABBARB NONE DETECTED  04/27/2014 1712  Urinalysis:  Recent Labs Lab 04/27/14 1712  COLORURINE YELLOW  LABSPEC 1.020  PHURINE 6.5  GLUCOSEU NEGATIVE  HGBUR NEGATIVE  BILIRUBINUR NEGATIVE  KETONESUR >80*  PROTEINUR NEGATIVE  UROBILINOGEN 1.0  NITRITE NEGATIVE  LEUKOCYTESUR NEGATIVE   Misc. Labs: Lamotrigine Coxsackie  Micro Results: Recent Results (from the past 240 hour(s))  RESPIRATORY VIRUS PANEL     Status: None   Collection Time    04/27/14  9:52 PM      Result Value Ref Range Status   Source - RVPAN NASAL SWAB   Corrected   Comment: CORRECTED ON 09/21 AT 2053: PREVIOUSLY REPORTED AS NASAL SWAB   Respiratory Syncytial Virus A NOT DETECTED   Final   Respiratory Syncytial Virus B NOT DETECTED   Final   Influenza A NOT DETECTED   Final   Influenza B NOT DETECTED   Final   Parainfluenza 1 NOT DETECTED   Final   Parainfluenza 2 NOT DETECTED   Final   Parainfluenza 3 NOT DETECTED   Final   Metapneumovirus NOT DETECTED   Final   Rhinovirus NOT DETECTED   Final   Adenovirus NOT DETECTED   Final   Influenza A  H1 NOT DETECTED   Final   Influenza A H3 NOT DETECTED   Final   Comment: (NOTE)           Normal Reference Range for each Analyte: NOT DETECTED     Testing performed using the Luminex xTAG Respiratory Viral Panel test     kit.     The analytical performance characteristics of this assay have been     determined by [facility].  The modifications have not been     cleared or approved by the FDA. This assay has been validated pursuant     to the CLIA regulations and is used for clinical purposes.     Performed at [facility]   Studies/Results: No results found. Medications: Scheduled Meds: . acetaminophen (TYLENOL) oral liquid 160 mg/5 mL  1,000 mg Oral Once  . antiseptic oral rinse  7 mL Mouth Rinse q12n4p  . chlorhexidine  15 mL Mouth Rinse BID  . divalproex  1,000 mg Oral QHS  . enoxaparin (LOVENOX) injection  40 mg Subcutaneous Q24H  . famotidine  20 mg Oral  BID  . polyvinyl alcohol  1 drop Both Eyes Daily   Continuous Infusions:   PRN Meds:.diphenhydrAMINE, guaiFENesin-dextromethorphan, HYDROcodone-acetaminophen, magic mouthwash w/lidocaine Assessment/Plan: 18 y.o presenting with worsening facial edema (lips, eyes), odynophagia, blistering to lips and oral cavity. He had had decreased oral intake and unable to take antiepileptics at home and had a seizure in the ED today.   #Stomatitis with facial edema, improved  -Likely medication reaction like angioedema vs Remo Lipps Johnson's related to Lamictal use (recently started 03/06/14 for seizures), neg HSV, pending Coxsackie  -Benadryl 25 mg q8 hours, Pepcid 20 mg bid -magic mouthwash with lidocaine prn, prn Norco 1-2 q6 prn, Artificial tears for eyes  -d/c later today   #Seizure  -history possible epilepsy (see Care Everywhere notes Dr. Jacelyn Grip [facility])  -Depakote ER 1000 mg qhs  -seizure precautions  -pending lamotrigine level   #URI symptoms  -negative respiratory virus panel, though URI symptoms could be adverse effect of Lamictal  -prn Robitussin for cough   #MSK chest pain  -chest pain likely 2/2 coughing  -prn Norco/Vicodin, cough syrup   #THC/tobacco abuse  -smoking cessation   #Anxiety/Depression history  -Previously neurologist rec. Outpatient f/u.   #F/E/N  -soft diet    #DVT  -Lov, scds    Dispo: D/c today   The patient does have a current PCP [facility] and does need an [facility] hospital follow-up appointment after discharge.  The patient does not have transportation limitations that hinder transportation to clinic appointments.  .Services Needed at time of discharge: Y = Yes, Blank = No PT:   OT:   RN:   Equipment:   Other:     LOS: 4 days   Cresenciano Genre, MD [phone] 05/01/2014, 10:55 AM

## 2014-05-01 NOTE — Discharge Instructions (Signed)
Seizure, Adult A seizure is abnormal electrical activity in the brain. Seizures usually last from 30 seconds to 2 minutes. There are various types of seizures. Before a seizure, you may have a warning sensation (aura) that a seizure is about to occur. An aura may include the following symptoms:   Fear or anxiety.  Nausea.  Feeling like the room is spinning (vertigo).  Vision changes, such as seeing flashing lights or spots. Common symptoms during a seizure include:  A change in attention or behavior (altered mental status).  Convulsions with rhythmic jerking movements.  Drooling.  Rapid eye movements.  Grunting.  Loss of bladder and bowel control.  Bitter taste in the mouth.  Tongue biting. After a seizure, you may feel confused and sleepy. You may also have an injury resulting from convulsions during the seizure. HOME CARE INSTRUCTIONS   If you are given medicines, take them exactly as prescribed by your health care provider.  Keep all follow-up appointments as directed by your health care provider.  Do not swim or drive or engage in risky activity during which a seizure could cause further injury to you or others until your health care provider says it is OK.  Get adequate rest.  Teach friends and family what to do if you have a seizure. They should:  Lay you on the ground to prevent a fall.  Put a cushion under your head.  Loosen any tight clothing around your neck.  Turn you on your side. If vomiting occurs, this helps keep your airway clear.  Stay with you until you recover.  Know whether or not you need emergency care. SEEK IMMEDIATE MEDICAL CARE IF:  The seizure lasts longer than 5 minutes.  The seizure is severe or you do not wake up immediately after the seizure.  You have an altered mental status after the seizure.  You are having more frequent or worsening seizures. Someone should drive you to the emergency department or call local emergency  services (911 in [place] MAKE SURE YOU:  Understand these instructions.  Will watch your condition.  Will get help right away if you are not doing well or get worse. Document Released: 07/24/2000 Document Revised: 05/17/2013 Document Reviewed: 03/08/2013 [facility] Patient Information 2015 [place], [place]. This information is not intended to replace advice given to you by your health care provider. Make sure you discuss any questions you have with your health care provider.  Drug Allergy Allergic reactions to medicines are common. Some allergic reactions are mild. A delayed type of drug allergy that occurs 1 week or more after exposure to a medicine or vaccine is called serum sickness. A life-threatening, sudden (acute) allergic reaction that involves the whole body is called anaphylaxis. CAUSES  "True" drug allergies occur when there is an allergic reaction to a medicine. This is caused by overactivity of the immune system. First, the body becomes sensitized. The immune system is triggered by your first exposure to the medicine. Following this first exposure, future exposure to the same medicine may be life-threatening. Almost any medicine can cause an allergic reaction. Common ones are:  Penicillin.  Sulfonamides (sulfa drugs).  Local anesthetics.  X-ray dyes that contain iodine. SYMPTOMS  Common symptoms of a minor allergic reaction are:  Swelling around the mouth.  An itchy red rash or hives.  Vomiting or diarrhea. Anaphylaxis can cause swelling of the mouth and throat. This makes it difficult to breathe and swallow. Severe reactions can be fatal within seconds, even after exposure  to only a trace amount of the drug that causes the reaction. HOME CARE INSTRUCTIONS   If you are unsure of what caused your reaction, keep a diary of foods and medicines used. Include the symptoms that followed. Avoid anything that causes reactions.  You may want to follow up with an allergy  specialist after the reaction has cleared in order to be tested to confirm the allergy. It is important to confirm that your reaction is an allergy, not just a side effect to the medicine. If you have a true allergy to a medicine, this may prevent that medicine and related medicines from being given to you when you are very ill.  If you have hives or a rash:  Take medicines as directed by your caregiver.  You may use an over-the-counter antihistamine (diphenhydramine) as needed.  Apply cold compresses to the skin or take baths in cool water. Avoid hot baths or showers.  If you are severely allergic:  Continuous observation after a severe reaction may be needed. Hospitalization is often required.  Wear a medical alert bracelet or necklace stating your allergy.  You and your family must learn how to use an anaphylaxis kit or give an epinephrine injection to temporarily treat an emergency allergic reaction. If you have had a severe reaction, always carry your epinephrine injection or anaphylaxis kit with you. This can be lifesaving if you have a severe reaction.  Do not drive or perform tasks after treatment until the medicines used to treat your reaction have worn off, or until your caregiver says it is okay. SEEK MEDICAL CARE IF:   You think you had an allergic reaction. Symptoms usually start within 30 minutes after exposure.  Symptoms are getting worse rather than better.  You develop new symptoms.  The symptoms that brought you to your caregiver return. SEEK IMMEDIATE MEDICAL CARE IF:   You have swelling of the mouth, difficulty breathing, or wheezing.  You have a tight feeling in your chest or throat.  You develop hives, swelling, or itching all over your body.  You develop severe vomiting or diarrhea.  You feel faint or pass out. This is an emergency. Use your epinephrine injection or anaphylaxis kit as you have been instructed. Call for emergency medical help. Even if you  improve after the injection, you need to be examined at a hospital emergency department. MAKE SURE YOU:   Understand these instructions.  Will watch your condition.  Will get help right away if you are not doing well or get worse. Document Released: 07/27/2005 Document Revised: 10/19/2011 Document Reviewed: 12/31/2010 [facility] Patient Information 2015 [place], [place]. This information is not intended to replace advice given to you by your health care provider. Make sure you discuss any questions you have with your health care provider.  Stomatitis Stomatitis is an inflammation of the mucous lining of the mouth. It can affect part of the mouth or the whole mouth. The intensity of symptoms can range from mild to severe. It can affect your cheek, teeth, gums, lips, or tongue. In almost all cases, the lining of the mouth becomes swollen, red, and painful. Painful ulcers can develop in your mouth. Stomatitis recurs in some people. CAUSES  There are many common causes of stomatitis. They include:  Viruses (such as cold sores or shingles).  Canker sores.  Bacteria (such as ulcerative gingivitis or sexually transmitted diseases).  Fungus or yeast (such as candidiasis or oral thrush).  Poor oral hygiene and poor nutrition (Vincent's stomatitis  or trench mouth).  Lack of vitamin B, vitamin C, or niacin.  Dentures or braces that do not fit properly.  High acid foods (uncommon).  Sharp or broken teeth.  Cheek biting.  Breathing through the mouth.  Chewing tobacco.  Allergy to toothpaste, mouthwash, candy, gum, lipstick, or some medicines.  Burning your mouth with hot drinks or food.  Exposure to dyes, heavy metals, acid fumes, or mineral dust. SYMPTOMS   Painful ulcers in the mouth.  Blisters in the mouth.  Bleeding gums.  Swollen gums.  Irritability.  Bad breath.  Bad taste in the mouth.  Fever.  Trouble eating because of burning and pain in the mouth. DIAGNOSIS    Your caregiver will examine your mouth and look for bleeding gums and mouth ulcers. Your caregiver may ask you about the medicines you are taking. Your caregiver may suggest a blood test and tissue sample (biopsy) of the mouth ulcer or mass if either is present. This will help find the cause of your condition. TREATMENT  Your treatment will depend on the cause of your condition. Your caregiver will first try to treat your symptoms.   You may be given pain medicine. Topical anesthetic may be used to numb the area if you have severe pain.  Your caregiver may prescribe antibiotic medicine if you have a bacterial infection.  Your caregiver may prescribe antifungal medicine if you have a fungal infection.  You may need to take antiviral medicine if you have a viral infection like herpes.  You may be asked to use medicated mouth rinses.  Your caregiver will advise you about proper brushing and using a soft toothbrush. You also need to get your teeth cleaned regularly. HOME CARE INSTRUCTIONS   Maintain good oral hygiene. This is especially important for transplant patients.  Brush your teeth carefully with a soft, nylon-bristled toothbrush.  Floss at least 2 times a day.  Clean your mouth after eating.  Rinse your mouth with salt water 3 to 4 times a day.  Gargle with cold water.  Use topical numbing medicines to decrease pain if recommended by your caregiver.  Stop smoking, and stop using chewing or smokeless tobacco.  Avoid eating hot and spicy foods.  Eat soft and bland food.  Reduce your stress wherever possible.  Eat healthy and nutritious foods. SEEK MEDICAL CARE IF:   Your symptoms persist or get worse.  You develop new symptoms.  Your mouth ulcers are present for more than 3 weeks.  Your mouth ulcers come back frequently.  You have increasing difficulty with normal eating and drinking.  You have increasing fatigue or weakness.  You develop loss of appetite or  nausea. SEEK IMMEDIATE MEDICAL CARE IF:   You have a fever.  You develop pain, redness, or sores around one or both eyes.  You cannot eat or drink because of pain or other symptoms.  You develop worsening weakness, or you faint.  You develop vomiting or diarrhea.  You develop chest pain, shortness of breath, or rapid and irregular heartbeats. MAKE SURE YOU:  Understand these instructions.  Will watch your condition.  Will get help right away if you are not doing well or get worse. Document Released: 05/24/2007 Document Revised: 10/19/2011 Document Reviewed: 03/05/2011 [facility] Patient Information 2015 [place], [place]. This information is not intended to replace advice given to you by your health care provider. Make sure you discuss any questions you have with your health care provider.

## 2014-05-03 LAB — COXSACKIE A VIRUS ANTIBODIES

## 2014-05-03 LAB — COXSACKIE B VIRUS ANTIBODIES
Coxsackie B1 Ab: 1:8 {titer} — ABNORMAL HIGH
Coxsackie B2 Ab: 1:8 {titer} — ABNORMAL HIGH
Coxsackie B3 Ab: <1:8 {titer}
Coxsackie B4 Ab: 1:8 {titer} — ABNORMAL HIGH
Coxsackie B5 Ab: 1:8 {titer} — ABNORMAL HIGH
Coxsackie B6 Ab: 1:8 {titer} — ABNORMAL HIGH

## 2014-05-03 NOTE — Discharge Summary (Signed)
I saw Mr. Brendan Tucker on day of discharge and assisted with the discharge planning.

## 2015-03-11 DIAGNOSIS — L511 Stevens-Johnson syndrome: Secondary | ICD-10-CM

## 2015-03-11 HISTORY — DX: Stevens-Johnson syndrome: L51.1

## 2016-07-31 ENCOUNTER — Other Ambulatory Visit: Payer: Self-pay | Admitting: Orthopedic Surgery

## 2016-08-05 ENCOUNTER — Encounter (HOSPITAL_BASED_OUTPATIENT_CLINIC_OR_DEPARTMENT_OTHER): Payer: Self-pay | Admitting: *Deleted

## 2016-08-05 NOTE — Progress Notes (Signed)
Pt hx of seizures and seizures following oral surgery d/w Dr Michelle Piperssey [facility].  [facility] Dr[facility] Dion SaucierLandau 08/14/15.

## 2016-08-13 ENCOUNTER — Ambulatory Visit (HOSPITAL_BASED_OUTPATIENT_CLINIC_OR_DEPARTMENT_OTHER)
Admission: RE | Admit: 2016-08-13 | Discharge: 2016-08-14 | Disposition: A | Discharge status: Home / Self Care | Payer: 59 | Source: Ambulatory Visit | Attending: Orthopedic Surgery | Admitting: Orthopedic Surgery

## 2016-08-13 ENCOUNTER — Ambulatory Visit (HOSPITAL_BASED_OUTPATIENT_CLINIC_OR_DEPARTMENT_OTHER): Payer: 59 | Admitting: Anesthesiology

## 2016-08-13 ENCOUNTER — Encounter (HOSPITAL_BASED_OUTPATIENT_CLINIC_OR_DEPARTMENT_OTHER): Payer: Self-pay | Admitting: Anesthesiology

## 2016-08-13 ENCOUNTER — Encounter (HOSPITAL_BASED_OUTPATIENT_CLINIC_OR_DEPARTMENT_OTHER)
Admission: RE | Disposition: A | Discharge status: Home / Self Care | Payer: Self-pay | Source: Ambulatory Visit | Attending: Orthopedic Surgery

## 2016-08-13 DIAGNOSIS — F329 Major depressive disorder, single episode, unspecified: Secondary | ICD-10-CM | POA: Insufficient documentation

## 2016-08-13 DIAGNOSIS — F1590 Other stimulant use, unspecified, uncomplicated: Secondary | ICD-10-CM | POA: Insufficient documentation

## 2016-08-13 DIAGNOSIS — R569 Unspecified convulsions: Secondary | ICD-10-CM | POA: Insufficient documentation

## 2016-08-13 DIAGNOSIS — F17210 Nicotine dependence, cigarettes, uncomplicated: Secondary | ICD-10-CM | POA: Diagnosis not present

## 2016-08-13 DIAGNOSIS — G8918 Other acute postprocedural pain: Secondary | ICD-10-CM | POA: Diagnosis not present

## 2016-08-13 DIAGNOSIS — S83281A Other tear of lateral meniscus, current injury, right knee, initial encounter: Secondary | ICD-10-CM | POA: Diagnosis not present

## 2016-08-13 DIAGNOSIS — F419 Anxiety disorder, unspecified: Secondary | ICD-10-CM | POA: Insufficient documentation

## 2016-08-13 DIAGNOSIS — S83241A Other tear of medial meniscus, current injury, right knee, initial encounter: Secondary | ICD-10-CM | POA: Diagnosis not present

## 2016-08-13 DIAGNOSIS — S83511A Sprain of anterior cruciate ligament of right knee, initial encounter: Secondary | ICD-10-CM | POA: Diagnosis not present

## 2016-08-13 HISTORY — PX: KNEE ARTHROSCOPY WITH ANTERIOR CRUCIATE LIGAMENT (ACL) REPAIR WITH HAMSTRING GRAFT: SHX5645

## 2016-08-13 HISTORY — DX: Stevens-Johnson syndrome: L51.1

## 2016-08-13 HISTORY — DX: Major depressive disorder, single episode, unspecified: F32.9

## 2016-08-13 HISTORY — DX: Sprain of anterior cruciate ligament of right knee, initial encounter: S83.511A

## 2016-08-13 HISTORY — DX: Anxiety disorder, unspecified: F41.9

## 2016-08-13 HISTORY — DX: Depression, unspecified: F32.A

## 2016-08-13 SURGERY — KNEE ARTHROSCOPY WITH ANTERIOR CRUCIATE LIGAMENT (ACL) REPAIR WITH HAMSTRING GRAFT
Anesthesia: Regional | Site: Knee | Laterality: Right

## 2016-08-13 MED ORDER — CEFAZOLIN IN D5W 1 GM/50ML IV SOLN
1.0000 g | Freq: Four times a day (QID) | INTRAVENOUS | Status: AC
  Administered 2016-08-13 – 2016-08-14 (×3): 1 g via INTRAVENOUS
  Filled 2016-08-13 (×3): qty 50

## 2016-08-13 MED ORDER — MORPHINE SULFATE 10 MG/ML IJ SOLN
INTRAMUSCULAR | Status: DC | PRN
  Administered 2016-08-13 (×2): 2 mg via INTRAVENOUS
  Administered 2016-08-13 (×2): 3 mg via INTRAVENOUS

## 2016-08-13 MED ORDER — FENTANYL CITRATE (PF) 100 MCG/2ML IJ SOLN
50.0000 ug | INTRAMUSCULAR | Status: DC | PRN
  Administered 2016-08-13: 100 ug via INTRAVENOUS

## 2016-08-13 MED ORDER — OXYCODONE HCL 5 MG PO TABS
ORAL_TABLET | ORAL | Status: AC
  Filled 2016-08-13: qty 1

## 2016-08-13 MED ORDER — OXYCODONE-ACETAMINOPHEN 10-325 MG PO TABS: 1.0000 | ORAL_TABLET | Freq: Four times a day (QID) | ORAL | 0 refills | Status: DC | PRN

## 2016-08-13 MED ORDER — LIDOCAINE 2% (20 MG/ML) 5 ML SYRINGE
INTRAMUSCULAR | Status: DC | PRN
  Administered 2016-08-13: 40 mg via INTRAVENOUS

## 2016-08-13 MED ORDER — METHOCARBAMOL 1000 MG/10ML IJ SOLN: 500.0000 mg | Freq: Four times a day (QID) | INTRAVENOUS | Status: DC | PRN

## 2016-08-13 MED ORDER — SENNA-DOCUSATE SODIUM 8.6-50 MG PO TABS: 2.0000 | ORAL_TABLET | Freq: Every day | ORAL | 1 refills | Status: DC

## 2016-08-13 MED ORDER — LACTATED RINGERS IV SOLN
INTRAVENOUS | Status: DC
  Administered 2016-08-13 (×2): via INTRAVENOUS

## 2016-08-13 MED ORDER — MAGNESIUM CITRATE PO SOLN: 1.0000 | Freq: Once | ORAL | Status: DC | PRN

## 2016-08-13 MED ORDER — POLYETHYLENE GLYCOL 3350 17 G PO PACK: 17.0000 g | PACK | Freq: Every day | ORAL | Status: DC | PRN

## 2016-08-13 MED ORDER — BISACODYL 10 MG RE SUPP: 10.0000 mg | Freq: Every day | RECTAL | Status: DC | PRN

## 2016-08-13 MED ORDER — METHOCARBAMOL 500 MG PO TABS
500.0000 mg | ORAL_TABLET | Freq: Four times a day (QID) | ORAL | Status: DC | PRN
  Administered 2016-08-13 – 2016-08-14 (×3): 500 mg via ORAL
  Filled 2016-08-13 (×3): qty 1

## 2016-08-13 MED ORDER — MORPHINE SULFATE (PF) 10 MG/ML IV SOLN
INTRAVENOUS | Status: AC
  Filled 2016-08-13: qty 1

## 2016-08-13 MED ORDER — ONDANSETRON HCL 4 MG/2ML IJ SOLN
INTRAMUSCULAR | Status: DC | PRN
  Administered 2016-08-13: 4 mg via INTRAVENOUS

## 2016-08-13 MED ORDER — ZOLPIDEM TARTRATE 5 MG PO TABS
5.0000 mg | ORAL_TABLET | Freq: Every evening | ORAL | Status: DC | PRN
  Administered 2016-08-13: 5 mg via ORAL
  Filled 2016-08-13: qty 1

## 2016-08-13 MED ORDER — ONDANSETRON HCL 4 MG PO TABS: 4.0000 mg | ORAL_TABLET | Freq: Three times a day (TID) | ORAL | 0 refills | Status: DC | PRN

## 2016-08-13 MED ORDER — MIDAZOLAM HCL 2 MG/2ML IJ SOLN
INTRAMUSCULAR | Status: AC
  Filled 2016-08-13: qty 2

## 2016-08-13 MED ORDER — CEFAZOLIN SODIUM-DEXTROSE 2-4 GM/100ML-% IV SOLN
2.0000 g | INTRAVENOUS | Status: AC
  Administered 2016-08-13: 2 g via INTRAVENOUS

## 2016-08-13 MED ORDER — ONDANSETRON HCL 4 MG PO TABS
4.0000 mg | ORAL_TABLET | Freq: Four times a day (QID) | ORAL | Status: DC | PRN
  Administered 2016-08-14: 4 mg via ORAL
  Filled 2016-08-13: qty 1

## 2016-08-13 MED ORDER — SCOPOLAMINE 1 MG/3DAYS TD PT72
1.0000 | MEDICATED_PATCH | Freq: Once | TRANSDERMAL | Status: DC | PRN
Start: 2016-08-13 — End: 2016-08-13

## 2016-08-13 MED ORDER — OXYCODONE HCL 5 MG PO TABS
5.0000 mg | ORAL_TABLET | ORAL | Status: DC | PRN
  Administered 2016-08-13 – 2016-08-14 (×3): 10 mg via ORAL
  Filled 2016-08-13 (×3): qty 2

## 2016-08-13 MED ORDER — SODIUM CHLORIDE 0.9 % IV SOLN
INTRAVENOUS | Status: DC
  Administered 2016-08-13: via INTRAVENOUS

## 2016-08-13 MED ORDER — LIDOCAINE 2% (20 MG/ML) 5 ML SYRINGE
INTRAMUSCULAR | Status: AC
  Filled 2016-08-13: qty 5

## 2016-08-13 MED ORDER — SENNA 8.6 MG PO TABS
1.0000 | ORAL_TABLET | Freq: Two times a day (BID) | ORAL | Status: DC
  Administered 2016-08-13: 8.6 mg via ORAL
  Filled 2016-08-13: qty 1

## 2016-08-13 MED ORDER — EPHEDRINE 5 MG/ML INJ
INTRAVENOUS | Status: AC
  Filled 2016-08-13: qty 10

## 2016-08-13 MED ORDER — METOCLOPRAMIDE HCL 5 MG PO TABS: 5.0000 mg | ORAL_TABLET | Freq: Three times a day (TID) | ORAL | Status: DC | PRN

## 2016-08-13 MED ORDER — METOCLOPRAMIDE HCL 5 MG/ML IJ SOLN: 5.0000 mg | Freq: Three times a day (TID) | INTRAMUSCULAR | Status: DC | PRN

## 2016-08-13 MED ORDER — BACLOFEN 10 MG PO TABS: 10.0000 mg | ORAL_TABLET | Freq: Three times a day (TID) | ORAL | 0 refills | Status: DC

## 2016-08-13 MED ORDER — PROPOFOL 500 MG/50ML IV EMUL
INTRAVENOUS | Status: AC
  Filled 2016-08-13: qty 50

## 2016-08-13 MED ORDER — SODIUM CHLORIDE 0.9 % IR SOLN
Status: DC | PRN
  Administered 2016-08-13: 9000 mL

## 2016-08-13 MED ORDER — FENTANYL CITRATE (PF) 100 MCG/2ML IJ SOLN
INTRAMUSCULAR | Status: AC
  Filled 2016-08-13: qty 2

## 2016-08-13 MED ORDER — HYDROMORPHONE HCL 1 MG/ML IJ SOLN
INTRAMUSCULAR | Status: AC
  Filled 2016-08-13: qty 1

## 2016-08-13 MED ORDER — BUPIVACAINE-EPINEPHRINE (PF) 0.5% -1:200000 IJ SOLN
INTRAMUSCULAR | Status: DC | PRN
  Administered 2016-08-13: 30 mL via PERINEURAL

## 2016-08-13 MED ORDER — OXYCODONE HCL 5 MG PO TABS
5.0000 mg | ORAL_TABLET | Freq: Once | ORAL | Status: AC
  Administered 2016-08-13: 5 mg via ORAL

## 2016-08-13 MED ORDER — DEXAMETHASONE SODIUM PHOSPHATE 4 MG/ML IJ SOLN
INTRAMUSCULAR | Status: DC | PRN
  Administered 2016-08-13: 10 mg via INTRAVENOUS

## 2016-08-13 MED ORDER — ONDANSETRON HCL 4 MG/2ML IJ SOLN
INTRAMUSCULAR | Status: AC
  Filled 2016-08-13: qty 2

## 2016-08-13 MED ORDER — ONDANSETRON HCL 4 MG/2ML IJ SOLN: 4.0000 mg | Freq: Four times a day (QID) | INTRAMUSCULAR | Status: DC | PRN

## 2016-08-13 MED ORDER — DEXAMETHASONE SODIUM PHOSPHATE 10 MG/ML IJ SOLN
INTRAMUSCULAR | Status: AC
  Filled 2016-08-13: qty 1

## 2016-08-13 MED ORDER — HYDROMORPHONE HCL 1 MG/ML IJ SOLN
0.2500 mg | INTRAMUSCULAR | Status: DC | PRN
  Administered 2016-08-13 (×3): 0.5 mg via INTRAVENOUS

## 2016-08-13 MED ORDER — PROPOFOL 10 MG/ML IV BOLUS
INTRAVENOUS | Status: DC | PRN
Start: 2016-08-13 — End: 2016-08-13
  Administered 2016-08-13: 300 mg via INTRAVENOUS

## 2016-08-13 MED ORDER — HYDROMORPHONE HCL 1 MG/ML IJ SOLN
0.5000 mg | INTRAMUSCULAR | Status: DC | PRN
  Administered 2016-08-14 (×2): 1 mg via INTRAVENOUS
  Filled 2016-08-13 (×3): qty 1

## 2016-08-13 MED ORDER — OXYCODONE-ACETAMINOPHEN 5-325 MG PO TABS
1.0000 | ORAL_TABLET | ORAL | Status: DC | PRN
  Administered 2016-08-13: 1 via ORAL
  Administered 2016-08-14: 2 via ORAL
  Filled 2016-08-13: qty 2
  Filled 2016-08-13: qty 1

## 2016-08-13 MED ORDER — EPHEDRINE SULFATE 50 MG/ML IJ SOLN
INTRAMUSCULAR | Status: DC | PRN
  Administered 2016-08-13: 10 mg via INTRAVENOUS

## 2016-08-13 MED ORDER — DOCUSATE SODIUM 100 MG PO CAPS
100.0000 mg | ORAL_CAPSULE | Freq: Two times a day (BID) | ORAL | Status: DC
  Administered 2016-08-13: 100 mg via ORAL
  Filled 2016-08-13: qty 1

## 2016-08-13 SURGICAL SUPPLY — 62 items
ANCHOR BUTTON TIGHTROPE ACL RT (Orthopedic Implant) ×3 IMPLANT
BANDAGE ACE 6X5 VEL STRL LF (GAUZE/BANDAGES/DRESSINGS) ×3 IMPLANT
BANDAGE ESMARK 6X9 LF (GAUZE/BANDAGES/DRESSINGS) ×1 IMPLANT
BLADE CUTTER GATOR 3.5 (BLADE) ×3 IMPLANT
BLADE SURG 15 STRL LF DISP TIS (BLADE) ×1 IMPLANT
BLADE SURG 15 STRL SS (BLADE) ×2
BNDG ESMARK 6X9 LF (GAUZE/BANDAGES/DRESSINGS) ×3
BUR OVAL 4.0 (BURR) ×3 IMPLANT
CLOSURE STERI-STRIP 1/2X4 (GAUZE/BANDAGES/DRESSINGS) ×1
CLSR STERI-STRIP ANTIMIC 1/2X4 (GAUZE/BANDAGES/DRESSINGS) ×2 IMPLANT
COVER BACK TABLE 60X90IN (DRAPES) ×3 IMPLANT
CUFF TOURNIQUET SINGLE 34IN LL (TOURNIQUET CUFF) ×3 IMPLANT
DRAPE ARTHROSCOPY W/POUCH 90 (DRAPES) ×3 IMPLANT
DRAPE IMP U-DRAPE 54X76 (DRAPES) ×6 IMPLANT
DRAPE OEC MINIVIEW 54X84 (DRAPES) ×3 IMPLANT
DRAPE U-SHAPE 47X51 STRL (DRAPES) ×3 IMPLANT
DRILL FLIPCUTTER II 8.5MM (INSTRUMENTS) ×1 IMPLANT
DURAPREP 26ML APPLICATOR (WOUND CARE) ×3 IMPLANT
ELECT REM PT RETURN 9FT ADLT (ELECTROSURGICAL) ×3
ELECTRODE REM PT RTRN 9FT ADLT (ELECTROSURGICAL) ×1 IMPLANT
FIBERSTICK 2 (SUTURE) ×3 IMPLANT
FLIPCUTTER II 8.5MM (INSTRUMENTS) ×3
GAUZE SPONGE 4X4 12PLY STRL (GAUZE/BANDAGES/DRESSINGS) ×3 IMPLANT
GLOVE BIO SURGEON STRL SZ8 (GLOVE) ×3 IMPLANT
GLOVE BIOGEL PI IND STRL 7.0 (GLOVE) ×2 IMPLANT
GLOVE BIOGEL PI IND STRL 8 (GLOVE) ×2 IMPLANT
GLOVE BIOGEL PI INDICATOR 7.0 (GLOVE) ×4
GLOVE BIOGEL PI INDICATOR 8 (GLOVE) ×4
GLOVE ECLIPSE 6.5 STRL STRAW (GLOVE) ×3 IMPLANT
GLOVE ORTHO TXT STRL SZ7.5 (GLOVE) ×3 IMPLANT
GOWN STRL REUS W/ TWL LRG LVL3 (GOWN DISPOSABLE) ×1 IMPLANT
GOWN STRL REUS W/ TWL XL LVL3 (GOWN DISPOSABLE) ×2 IMPLANT
GOWN STRL REUS W/TWL LRG LVL3 (GOWN DISPOSABLE) ×2
GOWN STRL REUS W/TWL XL LVL3 (GOWN DISPOSABLE) ×4
IMMOBILIZER KNEE 24 THIGH 36 (MISCELLANEOUS) ×2 IMPLANT
IMMOBILIZER KNEE 24 UNIV (MISCELLANEOUS) ×6
IV NS IRRIG 3000ML ARTHROMATIC (IV SOLUTION) ×6 IMPLANT
KNEE WRAP E Z 3 GEL PACK (MISCELLANEOUS) ×3 IMPLANT
MANIFOLD NEPTUNE II (INSTRUMENTS) ×3 IMPLANT
NDL SUT 6 .5 CRC .975X.05 MAYO (NEEDLE) ×1 IMPLANT
NEEDLE MAYO TAPER (NEEDLE) ×2
NS IRRIG 1000ML POUR BTL (IV SOLUTION) ×3 IMPLANT
PACK ARTHROSCOPY DSU (CUSTOM PROCEDURE TRAY) ×3 IMPLANT
PACK BASIN DAY SURGERY FS (CUSTOM PROCEDURE TRAY) ×3 IMPLANT
PADDING CAST COTTON 6X4 STRL (CAST SUPPLIES) ×3 IMPLANT
PENCIL BUTTON HOLSTER BLD 10FT (ELECTRODE) ×3 IMPLANT
PROBE BIPOLAR ATHRO 135MM 90D (MISCELLANEOUS) ×3 IMPLANT
SCREW BIO INTER 9X28 (Screw) ×3 IMPLANT
SET ARTHROSCOPY TUBING (MISCELLANEOUS) ×2
SET ARTHROSCOPY TUBING LN (MISCELLANEOUS) ×1 IMPLANT
SLEEVE SCD COMPRESS KNEE MED (MISCELLANEOUS) ×3 IMPLANT
SPONGE LAP 4X18 X RAY DECT (DISPOSABLE) ×3 IMPLANT
SUT 2 FIBERLOOP 20 STRT BLUE (SUTURE) ×9
SUT FIBERWIRE #2 38 T-5 BLUE (SUTURE) ×3
SUT VIC AB 0 CT1 27 (SUTURE) ×2
SUT VIC AB 0 CT1 27XBRD ANBCTR (SUTURE) ×1 IMPLANT
SUT VICRYL 3-0 CR8 SH (SUTURE) ×3 IMPLANT
SUTURE 2 FIBERLOOP 20 STRT BLU (SUTURE) ×3 IMPLANT
SUTURE FIBERWR #2 38 T-5 BLUE (SUTURE) ×1 IMPLANT
TOWEL OR 17X24 6PK STRL BLUE (TOWEL DISPOSABLE) ×3 IMPLANT
TOWEL OR NON WOVEN STRL DISP B (DISPOSABLE) ×3 IMPLANT
WATER STERILE IRR 1000ML POUR (IV SOLUTION) ×3 IMPLANT

## 2016-08-13 NOTE — Progress Notes (Signed)
Assisted Dr. Edmond Fitzgerald with right, ultrasound guided, femoral block. Side rails up, monitors on throughout procedure. See vital signs in flow sheet. Tolerated Procedure well. 

## 2016-08-13 NOTE — Transfer of Care (Signed)
Immediate Anesthesia Transfer of Care Note  Patient: Beatrix Shipperharles J Eggert  Procedure(s) Performed: Procedure(s): RIGHT KNEE ARTHROSCOPY,ANTERIOR CRUCIATE LIAMENT AUTOGRAFT HAMSTRING (Right)  Patient Location: PACU  Anesthesia Type:General  Level of Consciousness: awake and sedated  Airway & Oxygen Therapy: Patient Spontanous Breathing and Patient connected to face mask oxygen  Post-op Assessment: Report given to RN and Post -op Vital signs reviewed and stable  Post vital signs: Reviewed and stable  Last Vitals:  Vitals:   08/13/16 1352 08/13/16 1618  BP:  134/90  Pulse: 72 84  Resp: 14 16  Temp:      Last Pain:  Vitals:   08/13/16 1206  TempSrc: Oral  PainSc: 6          Complications: No apparent anesthesia complications``````
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
## 2016-08-13 NOTE — Anesthesia Procedure Notes (Signed)
Procedure Name: LMA Insertion Performed by: Matthews Franks W Pre-anesthesia Checklist: Patient identified, Emergency Drugs available, Suction available and Patient being monitored Patient Re-evaluated:Patient Re-evaluated prior to inductionOxygen Delivery Method: Circle system utilized Preoxygenation: Pre-oxygenation with 100% oxygen Intubation Type: IV induction Ventilation: Mask ventilation without difficulty LMA: LMA inserted LMA Size: 5.0 Number of attempts: 1 Placement Confirmation: positive ETCO2 Tube secured with: Tape Dental Injury: Teeth and Oropharynx as per pre-operative assessment        

## 2016-08-13 NOTE — Discharge Instructions (Signed)
Diet: As you were doing prior to hospitalization  ° °Shower:  May shower but keep the wounds dry, use an occlusive plastic wrap, NO SOAKING IN TUB.  If the bandage gets wet, change with a clean dry gauze.  If you have a splint on, leave the splint in place and keep the splint dry with a plastic bag. ° °Dressing:  You may change your dressing 3-5 days after surgery, unless you have a splint.  If you have a splint, then just leave the splint in place and we will change your bandages during your first follow-up appointment.   ° °If you had hand or foot surgery, we will plan to remove your stitches in about 2 weeks in the office.  For all other surgeries, there are sticky tapes (steri-strips) on your wounds and all the stitches are absorbable.  Leave the steri-strips in place when changing your dressings, they will peel off with time, usually 2-3 weeks. ° °Activity:  Increase activity slowly as tolerated, but follow the weight bearing instructions below.  The rules on driving is that you can not be taking narcotics while you drive, and you must feel in control of the vehicle.   ° °Weight Bearing:   As tolerated.   ° °To prevent constipation: you may use a stool softener such as - ° °Colace (over the counter) 100 mg by mouth twice a day  °Drink plenty of fluids (prune juice may be helpful) and high fiber foods °Miralax (over the counter) for constipation as needed.   ° °Itching:  If you experience itching with your medications, try taking only a single pain pill, or even half a pain pill at a time.  You may take up to 10 pain pills per day, and you can also use benadryl over the counter for itching or also to help with sleep.  ° °Precautions:  If you experience chest pain or shortness of breath - call 911 immediately for transfer to the hospital emergency department!! ° °If you develop a fever greater that 101 F, purulent drainage from wound, increased redness or drainage from wound, or calf pain -- Call the office at  [phone]                                                °Follow- Up Appointment:  Please call for an appointment to be seen in 2 weeks [place] - [phone] ° ° °Regional Anesthesia Blocks ° °1. Numbness or the inability to move the "blocked" extremity may last from 3-48 hours after placement. The length of time depends on the medication injected and your individual response to the medication. If the numbness is not going away after 48 hours, call your surgeon. ° °2. The extremity that is blocked will need to be protected until the numbness is gone and the  Strength has returned. Because you cannot feel it, you will need to take extra care to avoid injury. Because it may be weak, you may have difficulty moving it or using it. You may not know what position it is in without looking at it while the block is in effect. ° °3. For blocks in the legs and feet, returning to weight bearing and walking needs to be done carefully. You will need to wait until the numbness is entirely gone and the strength has returned. You should be able   to move your leg and foot normally before you try and bear weight or walk. You will need someone to be with you when you first try to ensure you do not fall and possibly risk injury. ° °4. Bruising and tenderness at the needle site are common side effects and will resolve in a few days. ° °5. Persistent numbness or new problems with movement should be communicated to the surgeon or the [facility] ([phone])/ [facility] ([phone]). ° °Post Anesthesia Home Care Instructions ° °Activity: °Get plenty of rest for the remainder of the day. A responsible adult should stay with you for 24 hours following the procedure.  °For the next 24 hours, DO NOT: °-Drive a car °-Operate machinery °-Drink alcoholic beverages °-Take any medication unless instructed by your physician °-Make any legal decisions or sign important papers. ° °Meals: °Start with liquid foods  such as gelatin or soup. Progress to regular foods as tolerated. Avoid greasy, spicy, heavy foods. If nausea and/or vomiting occur, drink only clear liquids until the nausea and/or vomiting subsides. Call your physician if vomiting continues. ° °Special Instructions/Symptoms: °Your throat may feel dry or sore from the anesthesia or the breathing tube placed in your throat during surgery. If this causes discomfort, gargle with warm salt water. The discomfort should disappear within 24 hours. ° °If you had a scopolamine patch placed behind your ear for the management of post- operative nausea and/or vomiting: ° °1. The medication in the patch is effective for 72 hours, after which it should be removed.  Wrap patch in a tissue and discard in the trash. Wash hands thoroughly with soap and water. °2. You may remove the patch earlier than 72 hours if you experience unpleasant side effects which may include dry mouth, dizziness or visual disturbances. °3. Avoid touching the patch. Wash your hands with soap and water after contact with the patch. °  ° ° ° °

## 2016-08-13 NOTE — Anesthesia Postprocedure Evaluation (Signed)
Anesthesia Post Note  Patient: Brendan Tucker  Procedure(s) Performed: Procedure(s) (LRB): RIGHT KNEE ARTHROSCOPY,ANTERIOR CRUCIATE LIAMENT AUTOGRAFT HAMSTRING (Right)  Patient location during evaluation: PACU Anesthesia Type: Regional and General Level of consciousness: sedated Pain management: pain level controlled Vital Signs Assessment: post-procedure vital signs reviewed and stable Respiratory status: spontaneous breathing and respiratory function stable Cardiovascular status: stable Anesthetic complications: no       Last Vitals:  Vitals:   08/13/16 1700 08/13/16 1715  BP: 108/77 115/83  Pulse: (!) 48 (!) 49  Resp: 12 19  Temp:      Last Pain:  Vitals:   08/13/16 1715  TempSrc:   PainSc: 6                  [name] DANIEL

## 2016-08-13 NOTE — Anesthesia Preprocedure Evaluation (Addendum)
Anesthesia Evaluation  Patient identified by MRN, date of birth, ID band Patient awake    Reviewed: Allergy & Precautions, H&P , NPO status , Patient's Chart, lab work & pertinent test results  Airway Mallampati: I  TM Distance: >3 FB Neck ROM: Full    Dental no notable dental hx. (+) Teeth Intact, Dental Advisory Given   Pulmonary Current Smoker,    Pulmonary exam normal breath sounds clear to auscultation       Cardiovascular negative cardio ROS   Rhythm:Regular Rate:Normal     Neuro/Psych Seizures - (last sz about a year ago), Well Controlled,  Anxiety Depression    GI/Hepatic negative GI ROS, Neg liver ROS,   Endo/Other  negative endocrine ROS  Renal/GU negative Renal ROS  negative genitourinary   Musculoskeletal   Abdominal   Peds  Hematology negative hematology ROS (+)   Anesthesia Other Findings   Reproductive/Obstetrics negative OB ROS                           Anesthesia Physical Anesthesia Plan  ASA: II  Anesthesia Plan: General and Regional   Post-op Pain Management: GA combined w/ Regional for post-op pain   Induction: Intravenous  Airway Management Planned: LMA  Additional Equipment:   Intra-op Plan:   Post-operative Plan: Extubation in OR  Informed Consent: I have reviewed the patients History and Physical, chart, labs and discussed the procedure including the risks, benefits and alternatives for the proposed anesthesia with the patient or authorized representative who has indicated his/her understanding and acceptance.   Dental advisory given  Plan Discussed with: CRNA  Anesthesia Plan Comments:         Anesthesia Quick Evaluation

## 2016-08-13 NOTE — Anesthesia Procedure Notes (Addendum)
Anesthesia Regional Block:  Femoral nerve block  Pre-Anesthetic Checklist: ,, timeout performed, Correct Patient, Correct Site, Correct Laterality, Correct Procedure, Correct Position, site marked, Risks and benefits discussed, pre-op evaluation,  At surgeon's request and post-op pain management  Laterality: Right  Prep: Maximum Sterile Barrier Precautions used, chloraprep       Needles:  Injection technique: Single-shot  Needle Type: Echogenic Stimulator Needle     Needle Length: 5cm 5 cm Needle Gauge: 22 and 22 G    Additional Needles:  Procedures: ultrasound guided (picture in chart) Femoral nerve block  Nerve Stimulator or Paresthesia:  Response: Patellar respose,   Additional Responses:   Narrative:  Start time: 08/13/2016 1:25 PM End time: 08/13/2016 1:35 PM Injection made incrementally with aspirations every 5 mL. Anesthesiologist: Gaynelle AduFITZGERALD, Catherine Oak  Additional Notes: 2% Lidocaine skin wheel.
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
## 2016-08-14 ENCOUNTER — Encounter (HOSPITAL_BASED_OUTPATIENT_CLINIC_OR_DEPARTMENT_OTHER): Payer: Self-pay | Admitting: Orthopedic Surgery

## 2016-08-14 DIAGNOSIS — S83511A Sprain of anterior cruciate ligament of right knee, initial encounter: Secondary | ICD-10-CM | POA: Diagnosis not present

## 2016-08-28 DIAGNOSIS — M25561 Pain in right knee: Secondary | ICD-10-CM | POA: Diagnosis not present

## 2016-08-28 DIAGNOSIS — S83511D Sprain of anterior cruciate ligament of right knee, subsequent encounter: Secondary | ICD-10-CM | POA: Diagnosis not present

## 2016-08-31 DIAGNOSIS — M25661 Stiffness of right knee, not elsewhere classified: Secondary | ICD-10-CM | POA: Diagnosis not present

## 2016-08-31 DIAGNOSIS — S83511D Sprain of anterior cruciate ligament of right knee, subsequent encounter: Secondary | ICD-10-CM | POA: Diagnosis not present

## 2016-08-31 DIAGNOSIS — M25561 Pain in right knee: Secondary | ICD-10-CM | POA: Diagnosis not present

## 2016-09-02 DIAGNOSIS — M25561 Pain in right knee: Secondary | ICD-10-CM | POA: Diagnosis not present

## 2016-09-02 DIAGNOSIS — S83511D Sprain of anterior cruciate ligament of right knee, subsequent encounter: Secondary | ICD-10-CM | POA: Diagnosis not present

## 2016-09-02 DIAGNOSIS — M25661 Stiffness of right knee, not elsewhere classified: Secondary | ICD-10-CM | POA: Diagnosis not present

## 2016-09-04 DIAGNOSIS — M25661 Stiffness of right knee, not elsewhere classified: Secondary | ICD-10-CM | POA: Diagnosis not present

## 2016-09-04 DIAGNOSIS — R262 Difficulty in walking, not elsewhere classified: Secondary | ICD-10-CM | POA: Diagnosis not present

## 2016-09-04 DIAGNOSIS — M25561 Pain in right knee: Secondary | ICD-10-CM | POA: Diagnosis not present

## 2016-09-08 DIAGNOSIS — M25661 Stiffness of right knee, not elsewhere classified: Secondary | ICD-10-CM | POA: Diagnosis not present

## 2016-09-08 DIAGNOSIS — M25561 Pain in right knee: Secondary | ICD-10-CM | POA: Diagnosis not present

## 2016-09-08 DIAGNOSIS — S83511D Sprain of anterior cruciate ligament of right knee, subsequent encounter: Secondary | ICD-10-CM | POA: Diagnosis not present

## 2016-09-15 DIAGNOSIS — M25561 Pain in right knee: Secondary | ICD-10-CM | POA: Diagnosis not present

## 2016-09-15 DIAGNOSIS — M25661 Stiffness of right knee, not elsewhere classified: Secondary | ICD-10-CM | POA: Diagnosis not present

## 2016-09-15 DIAGNOSIS — R262 Difficulty in walking, not elsewhere classified: Secondary | ICD-10-CM | POA: Diagnosis not present

## 2016-09-18 DIAGNOSIS — M25561 Pain in right knee: Secondary | ICD-10-CM | POA: Diagnosis not present

## 2016-09-18 DIAGNOSIS — M25661 Stiffness of right knee, not elsewhere classified: Secondary | ICD-10-CM | POA: Diagnosis not present

## 2016-09-18 DIAGNOSIS — R262 Difficulty in walking, not elsewhere classified: Secondary | ICD-10-CM | POA: Diagnosis not present

## 2016-09-21 DIAGNOSIS — R262 Difficulty in walking, not elsewhere classified: Secondary | ICD-10-CM | POA: Diagnosis not present

## 2016-09-21 DIAGNOSIS — M25561 Pain in right knee: Secondary | ICD-10-CM | POA: Diagnosis not present

## 2016-09-21 DIAGNOSIS — M25661 Stiffness of right knee, not elsewhere classified: Secondary | ICD-10-CM | POA: Diagnosis not present

## 2016-09-23 DIAGNOSIS — S83511D Sprain of anterior cruciate ligament of right knee, subsequent encounter: Secondary | ICD-10-CM | POA: Diagnosis not present

## 2016-09-23 DIAGNOSIS — M25561 Pain in right knee: Secondary | ICD-10-CM | POA: Diagnosis not present

## 2016-09-23 DIAGNOSIS — M25661 Stiffness of right knee, not elsewhere classified: Secondary | ICD-10-CM | POA: Diagnosis not present

## 2016-10-07 DIAGNOSIS — R262 Difficulty in walking, not elsewhere classified: Secondary | ICD-10-CM | POA: Diagnosis not present

## 2016-10-07 DIAGNOSIS — M25661 Stiffness of right knee, not elsewhere classified: Secondary | ICD-10-CM | POA: Diagnosis not present

## 2016-10-07 DIAGNOSIS — M25561 Pain in right knee: Secondary | ICD-10-CM | POA: Diagnosis not present

## 2016-10-08 DIAGNOSIS — M25661 Stiffness of right knee, not elsewhere classified: Secondary | ICD-10-CM | POA: Diagnosis not present

## 2016-10-08 DIAGNOSIS — M25561 Pain in right knee: Secondary | ICD-10-CM | POA: Diagnosis not present

## 2016-10-08 DIAGNOSIS — R262 Difficulty in walking, not elsewhere classified: Secondary | ICD-10-CM | POA: Diagnosis not present

## 2016-11-09 DIAGNOSIS — Z13220 Encounter for screening for lipoid disorders: Secondary | ICD-10-CM | POA: Diagnosis not present

## 2016-11-09 DIAGNOSIS — Z0000 Encounter for general adult medical examination without abnormal findings: Secondary | ICD-10-CM | POA: Diagnosis not present

## 2016-11-26 DIAGNOSIS — S83511D Sprain of anterior cruciate ligament of right knee, subsequent encounter: Secondary | ICD-10-CM | POA: Diagnosis not present

## 2017-02-03 DIAGNOSIS — S83511D Sprain of anterior cruciate ligament of right knee, subsequent encounter: Secondary | ICD-10-CM | POA: Diagnosis not present

## 2017-05-05 DIAGNOSIS — S83511D Sprain of anterior cruciate ligament of right knee, subsequent encounter: Secondary | ICD-10-CM | POA: Diagnosis not present

## 2017-08-12 DIAGNOSIS — R05 Cough: Secondary | ICD-10-CM | POA: Diagnosis not present

## 2017-08-12 DIAGNOSIS — J0100 Acute maxillary sinusitis, unspecified: Secondary | ICD-10-CM | POA: Diagnosis not present

## 2017-10-01 ENCOUNTER — Observation Stay
Admission: EM | Admit: 2017-10-01 | Discharge: 2017-10-03 | Disposition: A | Discharge status: Home / Self Care | Payer: 59 | Attending: Internal Medicine | Admitting: Internal Medicine

## 2017-10-01 ENCOUNTER — Other Ambulatory Visit: Payer: Self-pay

## 2017-10-01 ENCOUNTER — Encounter: Payer: Self-pay | Admitting: Emergency Medicine

## 2017-10-01 DIAGNOSIS — X58XXXA Exposure to other specified factors, initial encounter: Secondary | ICD-10-CM | POA: Diagnosis not present

## 2017-10-01 DIAGNOSIS — R070 Pain in throat: Secondary | ICD-10-CM | POA: Diagnosis not present

## 2017-10-01 DIAGNOSIS — Z8669 Personal history of other diseases of the nervous system and sense organs: Secondary | ICD-10-CM | POA: Diagnosis not present

## 2017-10-01 DIAGNOSIS — L509 Urticaria, unspecified: Secondary | ICD-10-CM | POA: Diagnosis present

## 2017-10-01 DIAGNOSIS — R059 Cough, unspecified: Secondary | ICD-10-CM

## 2017-10-01 DIAGNOSIS — T7849XA Other allergy, initial encounter: Secondary | ICD-10-CM | POA: Diagnosis not present

## 2017-10-01 DIAGNOSIS — F17210 Nicotine dependence, cigarettes, uncomplicated: Secondary | ICD-10-CM | POA: Insufficient documentation

## 2017-10-01 DIAGNOSIS — R220 Localized swelling, mass and lump, head: Secondary | ICD-10-CM | POA: Diagnosis not present

## 2017-10-01 DIAGNOSIS — T783XXA Angioneurotic edema, initial encounter: Secondary | ICD-10-CM | POA: Diagnosis not present

## 2017-10-01 DIAGNOSIS — R21 Rash and other nonspecific skin eruption: Secondary | ICD-10-CM | POA: Diagnosis not present

## 2017-10-01 DIAGNOSIS — R05 Cough: Secondary | ICD-10-CM

## 2017-10-01 NOTE — H&P (Signed)
[facility] [place] [facility]   PATIENT NAME: Brendan Tucker    MR#:  [id number]  DATE OF BIRTH:  May 15, 1996  DATE OF ADMISSION:  10/01/2017  PRIMARY CARE PHYSICIAN: Henrine Screws, MD   REQUESTING/REFERRING PHYSICIAN: Cyril Loosen, MD  CHIEF COMPLAINT:   Chief Complaint  Patient presents with  . Allergic Reaction    HISTORY OF PRESENT ILLNESS:  Brendan Tucker  is a 22 y.o. male who presents with swelling of his lips and tongue.  Patient states that this started earlier this evening, it was very sudden in onset.  Over the last several weeks he has had a couple of episodes of hives.  He is unable to identify any particular substance that may be causative.  He was given epinephrine and Solu-Medrol and Pepcid by EMS.  Hospitalist were called for admission.  PAST MEDICAL HISTORY:   Past Medical History:  Diagnosis Date  . Anxiety   . Complete tear of right ACL 08/13/2016  . Depression   . History of strep sore throat   . Seizures (HCC)    last seizure Sept 2016  . 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 DENTAL SURGERY     abscess in mouth  . KNEE ARTHROSCOPY WITH ANTERIOR CRUCIATE LIGAMENT (ACL) REPAIR WITH HAMSTRING GRAFT Right 08/13/2016   Procedure: RIGHT KNEE ARTHROSCOPY,ANTERIOR CRUCIATE LIAMENT AUTOGRAFT HAMSTRING;  Surgeon: Teryl Lucy, MD;  Location: [facility];  Service: Orthopedics;  Laterality: Right;  . SHOULDER SURGERY     left  . WISDOM TOOTH EXTRACTION      SOCIAL HISTORY:   Social History   Tobacco Use  . Smoking status: Current Every Day Smoker    Packs/day: 1.00    Types: Cigarettes  . Smokeless tobacco: Never Used  Substance Use Topics  . Alcohol use: Yes    Comment: occasionally    FAMILY HISTORY:   Family History  Problem Relation Age of Onset  . Depression Mother   . Bipolar disorder  Paternal Uncle   . Cancer Unknown        paternal grandmother with cancer base of tongue   . Heart disease Unknown        maternal GM  . COPD Unknown        maternal GM  . ALS Unknown        maternal GF    DRUG ALLERGIES:   Allergies  Allergen Reactions  . Ativan [Lorazepam] Other (See Comments)    Causes agitation and aggressive  . Ketamine Other (See Comments)    hallucinations  . Lamictal [Lamotrigine]     Stomatitis, lip/facial swelling   . Biaxin [Clarithromycin] Nausea And Vomiting  . Sulfa Antibiotics Rash    MEDICATIONS AT HOME:   Prior to Admission medications   Medication Sig Start Date End Date Taking? Authorizing Provider  diphenhydrAMINE (BENADRYL) 25 MG tablet Take 12.5 mg by mouth at bedtime as needed (for swelling in lips).    [provider]    REVIEW OF SYSTEMS:  Review of Systems  Constitutional: Negative for chills, fever, malaise/fatigue and weight loss.  HENT: Negative for ear pain, hearing loss and tinnitus.        Swelling of his lips and tongue  Eyes: Negative for blurred vision, double vision, pain and redness.  Respiratory: Negative for cough, hemoptysis and shortness of breath.   Cardiovascular:  Negative for chest pain, palpitations, orthopnea and leg swelling.  Gastrointestinal: Negative for abdominal pain, constipation, diarrhea, nausea and vomiting.  Genitourinary: Negative for dysuria, frequency and hematuria.  Musculoskeletal: Negative for back pain, joint pain and neck pain.  Skin:       No acne, rash, or lesions  Neurological: Negative for dizziness, tremors, focal weakness and weakness.  Endo/Heme/Allergies: Negative for polydipsia. Does not bruise/bleed easily.  Psychiatric/Behavioral: Negative for depression. The patient is not nervous/anxious and does not have insomnia.      VITAL SIGNS:   Vitals:   10/01/17 2230 10/01/17 2245 10/01/17 2300 10/01/17 2315  BP: 100/79 113/71 92/66 100/67  Pulse: (!) 57  (!) 53 (!) 55   Resp: 13 18 19 14   Temp:      TempSrc:      SpO2: 98%  98% 99%  Weight:      Height:       Wt Readings from Last 3 Encounters:  10/01/17 74.8 kg (165 lb)  08/13/16 67.8 kg (149 lb 8 oz)  04/27/14 68 kg (149 lb 14.6 oz)    PHYSICAL EXAMINATION:  Physical Exam  Vitals reviewed. Constitutional: He is oriented to person, place, and time. He appears well-developed and well-nourished. No distress.  HENT:  Head: Normocephalic and atraumatic.  Mouth/Throat: Oropharynx is clear and moist.  Angioedema of his lips and tongue.  This does not extend to the base of his tongue or to his throat.  Eyes: Conjunctivae and EOM are normal. Pupils are equal, round, and reactive to light. No scleral icterus.  Neck: Normal range of motion. Neck supple. No JVD present. No thyromegaly present.  Cardiovascular: Normal rate, regular rhythm and intact distal pulses. Exam reveals no gallop and no friction rub.  No murmur heard. Respiratory: Effort normal and breath sounds normal. No respiratory distress. He has no wheezes. He has no rales.  GI: Soft. Bowel sounds are normal. He exhibits no distension. There is no tenderness.  Musculoskeletal: Normal range of motion. He exhibits no edema.  No arthritis, no gout  Lymphadenopathy:    He has no cervical adenopathy.  Neurological: He is alert and oriented to person, place, and time. No cranial nerve deficit.  No dysarthria, no aphasia  Skin: Skin is warm and dry. No rash noted. No erythema.  Psychiatric: He has a normal mood and affect. His behavior is normal. Judgment and thought content normal.    LABORATORY PANEL:   CBC No results for input(s): WBC, HGB, HCT, PLT in the last 168 hours. ------------------------------------------------------------------------------------------------------------------  Chemistries  No results for input(s): NA, K, CL, CO2, GLUCOSE, BUN, CREATININE, CALCIUM, MG, AST, ALT, ALKPHOS, BILITOT in the last 168 hours.  Invalid  input(s): GFRCGP ------------------------------------------------------------------------------------------------------------------  Cardiac Enzymes No results for input(s): TROPONINI in the last 168 hours. ------------------------------------------------------------------------------------------------------------------  RADIOLOGY:  No results found.  EKG:   Orders placed or performed during the hospital encounter of 04/27/14  . EKG 12-Lead  . EKG 12-Lead    IMPRESSION AND PLAN:  Principal Problem:   Angioedema -continue IV steroids, Pepcid and Claritin.  Swelling is no longer progressing.  Will monitor closely.  Unclear causative agent at this time.  Patient would likely benefit from consult to allergist at discharge.  All the records are reviewed and case discussed with ED provider. Management plans discussed with the patient and/or family.  DVT PROPHYLAXIS: SubQ lovenox  GI PROPHYLAXIS: PPI  ADMISSION STATUS: Observation  CODE STATUS: Full Code Status History    Date Active  Date Inactive Code Status Order ID Comments User Context   08/13/2016 18:37 08/14/2016 12:09 Full Code [id number]  Teryl Lucy, MD Inpatient   04/27/2014 19:33 23-May-202015 16:22 Full Code [id number]  Annett Gula Inpatient      TOTAL TIME TAKING CARE OF THIS PATIENT: 45 minutes.   Israel Wunder FIELDING 10/01/2017, 11:42 PM  [employer]  [phone]  CC: Primary care physician; Henrine Screws, MD  Note:  This document was prepared using Dragon voice recognition software and may include unintentional dictation errors.

## 2017-10-01 NOTE — ED Provider Notes (Signed)
[facility] Emergency Department Provider Note   ____________________________________________    I have reviewed the triage vital signs and the nursing notes.   HISTORY  Chief Complaint Allergic Reaction     HPI Brendan Tucker is a 22 y.o. male who presents with an allergic reaction.  Approximately 1 hour prior to arrival patient developed hives lip swelling and tongue swelling.  EMS was called he was given epinephrine IM, Solu-Medrol Pepcid Benadryl IV.  Patient denies throat swelling.  History of hives approximately 1 month ago.  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  Patient denies difficult he breathing.  EMS reports the patient was initially hypotensive on arrival, given 500 mL bolus.  No abdominal pain no nausea or vomiting.  Unclear what caused allergic reaction   Past Medical History:  Diagnosis Date  . Anxiety   . Complete tear of right ACL 08/13/2016  . Depression   . History of strep sore throat   . Seizures (HCC)    last seizure Sept 2016  . Stevens-Johnson disease (HCC) 03/11/2015   reaction to lamictal    Patient Active Problem List   Diagnosis Date Noted  . Complete tear of right ACL 08/13/2016  . Right ACL tear 08/13/2016  . Medication reaction 04/29/2014  . Seizure (HCC) 04/27/2014  . Starvation ketoacidosis 04/27/2014  . Musculoskeletal chest pain 04/27/2014  . Tobacco abuse 04/27/2014  . Marijuana use 04/27/2014  . Stomatitis 04/27/2014    Past Surgical History:  Procedure Laterality Date  . DENTAL SURGERY     abscess in mouth  . KNEE ARTHROSCOPY WITH ANTERIOR CRUCIATE LIGAMENT (ACL) REPAIR WITH HAMSTRING GRAFT Right 08/13/2016   Procedure: RIGHT KNEE ARTHROSCOPY,ANTERIOR CRUCIATE LIAMENT AUTOGRAFT HAMSTRING;  Surgeon: Teryl LucyJoshua Landau, MD;  Location: [facility];  Service: Orthopedics;  Laterality: Right;  . SHOULDER SURGERY     left  . WISDOM TOOTH EXTRACTION      Prior  to Admission medications   Medication Sig Start Date End Date Taking? Authorizing Provider  baclofen (LIORESAL) 10 MG tablet Take 1 tablet (10 mg total) by mouth 3 (three) times daily. As needed for muscle spasm Patient not taking: Reported on 10/01/2017 08/13/16   Teryl LucyLandau, Joshua, MD  diphenhydrAMINE (BENADRYL) 25 MG tablet Take 12.5 mg by mouth at bedtime as needed (for swelling in lips).    [provider]  ondansetron (ZOFRAN) 4 MG tablet Take 1 tablet (4 mg total) by mouth every 8 (eight) hours as needed for nausea or vomiting. Patient not taking: Reported on 10/01/2017 08/13/16   Teryl LucyLandau, Joshua, MD  oxyCODONE-acetaminophen (PERCOCET) 10-325 MG tablet Take 1-2 tablets by mouth every 6 (six) hours as needed for pain. MAXIMUM TOTAL ACETAMINOPHEN DOSE IS 4000 MG PER DAY Patient not taking: Reported on 10/01/2017 08/13/16   Teryl LucyLandau, Joshua, MD  sennosides-docusate sodium (SENOKOT-S) 8.6-50 MG tablet Take 2 tablets by mouth daily. Patient not taking: Reported on 10/01/2017 08/13/16   Teryl LucyLandau, Joshua, MD     Allergies Ativan [lorazepam]; Ketamine; Lamictal [lamotrigine]; Biaxin [clarithromycin]; and Sulfa antibiotics  Family History  Problem Relation Age of Onset  . Depression Mother   . Bipolar disorder Paternal Uncle   . Cancer Unknown        paternal grandmother with cancer base of tongue   . Heart disease Unknown        maternal GM  . COPD Unknown        maternal GM  . ALS Unknown  maternal GF    Social History Social History   Tobacco Use  . Smoking status: Current Every Day Smoker    Packs/day: 1.00    Types: Cigarettes  . Smokeless tobacco: Never Used  Substance Use Topics  . Alcohol use: Yes    Comment: occasionally  . Drug use: Yes    Types: Marijuana    Review of Systems  Constitutional: Dizziness, now resolved Eyes: No visual changes.  ENT: No throat swelling, tongue swelling and lip swelling as above. Cardiovascular: Denies chest pain. Respiratory: Denies  shortness of breath.  No wheezing Gastrointestinal: No abdominal pain.   Genitourinary: Negative for dysuria. Musculoskeletal: Negative for joint swelling Skin: Hives initially, now resolved Neurological: Negative for headaches   ____________________________________________   PHYSICAL EXAM:  VITAL SIGNS: ED Triage Vitals  Enc Vitals Group     BP 10/01/17 1830 100/73     Pulse Rate 10/01/17 1830 73     Resp 10/01/17 1830 20     Temp 10/01/17 1830 97.6 F (36.4 C)     Temp Source 10/01/17 1830 Oral     SpO2 10/01/17 1830 97 %     Weight 10/01/17 1832 74.8 kg (165 lb)     Height 10/01/17 1832 1.905 m (6\' 3" )     Head Circumference --      Peak Flow --      Pain Score 10/01/17 1831 8     Pain Loc --      Pain Edu? --      Excl. in GC? --     Constitutional: Alert and oriented.  Eyes: Conjunctivae are normal.  Head: Atraumatic. Nose: No congestion/rhinnorhea. Mouth/Throat: Mucous membranes are moist.  Lips swollen upper and lower.  Tongue significantly swollen anteriorly, no pharyngeal swelling or uvular swelling noted, no stridor Neck:  Painless ROM Cardiovascular: Normal rate, regular rhythm. Grossly normal heart sounds.  Good peripheral circulation. Respiratory: Normal respiratory effort.  No retractions.  No wheezing Gastrointestinal: Soft and nontender. No distention.   Genitourinary: deferred Musculoskeletal: Warm and well perfused.  No joint swelling Neurologic:  Normal speech and language. No gross focal neurologic deficits are appreciated.  Skin:  Skin is warm, dry and intact.  Psychiatric: Mood and affect are normal. Speech and behavior are normal.  ____________________________________________   LABS (all labs ordered are listed, but only abnormal results are displayed)  Labs Reviewed - No data to  display ____________________________________________  EKG  None ____________________________________________  RADIOLOGY  None ____________________________________________   PROCEDURES  Procedure(s) performed: No  Procedures   Critical Care performed: yes  CRITICAL CARE Performed by: Jene Every   Total critical care time: 35 minutes  Critical care time was exclusive of separately billable procedures and treating other patients.  Critical care was necessary to treat or prevent imminent or life-threatening deterioration.  Critical care was time spent personally by me on the following activities: development of treatment plan with patient and/or surrogate as well as nursing, discussions with consultants, evaluation of patient's response to treatment, examination of patient, obtaining history from patient or surrogate, ordering and performing treatments and interventions, ordering and review of laboratory studies, ordering and review of radiographic studies, pulse oximetry and re-evaluation of patient's condition.  ____________________________________________   INITIAL IMPRESSION / ASSESSMENT AND PLAN / ED COURSE  Pertinent labs & imaging results that were available during my care of the patient were reviewed by me and considered in my medical decision making (see chart for details).  Patient presents after anaphylactic  reaction with angioedema.  Hives apparently resolved after medications given by EMS however still has significant angioedema.  He will require close monitoring.  Airway stable at this point, notified patient to let us know if any swelling of throat or worsening of tongue swelling   ----------------------------------------- 9:03 PM on 10/01/2017 -----------------------------------------  Lip swelling has slightly decreased, no throat swelling, tongue remains swollen no complaints   ----------------------------------------- 10:50 PM on  10/01/2017 -----------------------------------------  Patient's tongue remains significantly swollen.  Will discuss with hospitalist for admission for observation    ____________________________________________   FINAL CLINICAL IMPRESSION(S) / ED DIAGNOSES  Final diagnoses:  Angioedema, initial encounter        Note:  This document was prepared using Dragon voice recognition software and may include unintentional dictation errors.    Jene Every, MD 10/01/17 2251

## 2017-10-01 NOTE — ED Triage Notes (Signed)
Pt arrives via [facility] from home s/p anaphylactic reation to unknown source. This happened to pt approx 1 month ago also. Pt arrived with swelling to his lower lip and tongue. Pt had hives at time of reaction.  Received epi pen and benadryl en route.

## 2017-10-02 ENCOUNTER — Encounter: Payer: Self-pay | Admitting: General Practice

## 2017-10-02 ENCOUNTER — Observation Stay: Payer: 59

## 2017-10-02 DIAGNOSIS — T783XXA Angioneurotic edema, initial encounter: Secondary | ICD-10-CM | POA: Diagnosis not present

## 2017-10-02 DIAGNOSIS — R05 Cough: Secondary | ICD-10-CM | POA: Diagnosis not present

## 2017-10-02 DIAGNOSIS — Z720 Tobacco use: Secondary | ICD-10-CM | POA: Diagnosis not present

## 2017-10-02 DIAGNOSIS — R569 Unspecified convulsions: Secondary | ICD-10-CM | POA: Diagnosis not present

## 2017-10-02 LAB — CBC
HCT: 44.4 % (ref 40.0–52.0)
HEMOGLOBIN: 15 g/dL (ref 13.0–18.0)
MCH: 31.3 pg (ref 26.0–34.0)
MCHC: 33.9 g/dL (ref 32.0–36.0)
MCV: 92.5 fL (ref 80.0–100.0)
Platelets: 249 10*3/uL (ref 150–440)
RBC: 4.8 MIL/uL (ref 4.40–5.90)
RDW: 12.6 % (ref 11.5–14.5)
WBC: 10.3 10*3/uL (ref 3.8–10.6)

## 2017-10-02 LAB — BASIC METABOLIC PANEL
Anion gap: 8 (ref 5–15)
BUN: 15 mg/dL (ref 6–20)
CALCIUM: 9 mg/dL (ref 8.9–10.3)
CHLORIDE: 106 mmol/L (ref 101–111)
CO2: 23 mmol/L (ref 22–32)
Creatinine, Ser: 1.05 mg/dL (ref 0.61–1.24)
GFR calc Af Amer: >60 mL/min (ref >60)
GFR calc non Af Amer: >60 mL/min (ref >60)
GLUCOSE: 160 mg/dL — AB (ref 65–99)
Potassium: 4.8 mmol/L (ref 3.5–5.1)
Sodium: 137 mmol/L (ref 135–145)

## 2017-10-02 MED ORDER — ONDANSETRON HCL 4 MG PO TABS: 4.0000 mg | ORAL_TABLET | Freq: Four times a day (QID) | ORAL | Status: DC | PRN

## 2017-10-02 MED ORDER — FAMOTIDINE 20 MG IN NS 100 ML IVPB: 20.0000 mg | Freq: Two times a day (BID) | INTRAVENOUS | Status: DC

## 2017-10-02 MED ORDER — ACETAMINOPHEN 650 MG RE SUPP: 650.0000 mg | Freq: Four times a day (QID) | RECTAL | Status: DC | PRN

## 2017-10-02 MED ORDER — GUAIFENESIN-DM 100-10 MG/5ML PO SYRP
5.0000 mL | ORAL_SOLUTION | Freq: Four times a day (QID) | ORAL | Status: DC | PRN
  Administered 2017-10-02: 5 mL via ORAL
  Filled 2017-10-02: qty 5

## 2017-10-02 MED ORDER — SODIUM CHLORIDE 0.9 % IV SOLN
INTRAVENOUS | Status: DC
  Administered 2017-10-02: 16:00:00 via INTRAVENOUS

## 2017-10-02 MED ORDER — ENOXAPARIN SODIUM 40 MG/0.4ML ~~LOC~~ SOLN
40.0000 mg | SUBCUTANEOUS | Status: DC
  Administered 2017-10-02: 40 mg via SUBCUTANEOUS
  Filled 2017-10-02: qty 0.4

## 2017-10-02 MED ORDER — LORATADINE 10 MG PO TABS
10.0000 mg | ORAL_TABLET | Freq: Two times a day (BID) | ORAL | Status: DC
  Administered 2017-10-02 – 2017-10-03 (×4): 10 mg via ORAL
  Filled 2017-10-02 (×4): qty 1

## 2017-10-02 MED ORDER — PROMETHAZINE HCL 25 MG/ML IJ SOLN
12.5000 mg | Freq: Four times a day (QID) | INTRAMUSCULAR | Status: DC | PRN
  Administered 2017-10-02: 12.5 mg via INTRAVENOUS
  Filled 2017-10-02: qty 1

## 2017-10-02 MED ORDER — ONDANSETRON HCL 4 MG/2ML IJ SOLN
4.0000 mg | Freq: Four times a day (QID) | INTRAMUSCULAR | Status: DC | PRN
  Administered 2017-10-02: 4 mg via INTRAVENOUS
  Filled 2017-10-02: qty 2

## 2017-10-02 MED ORDER — ACETAMINOPHEN 325 MG PO TABS: 650.0000 mg | ORAL_TABLET | Freq: Four times a day (QID) | ORAL | Status: DC | PRN

## 2017-10-02 MED ORDER — FAMOTIDINE 20 MG IN NS 100 ML IVPB
20.0000 mg | Freq: Two times a day (BID) | INTRAVENOUS | Status: DC
  Administered 2017-10-02 – 2017-10-03 (×4): 20 mg via INTRAVENOUS
  Filled 2017-10-02 (×2): qty 100
  Filled 2017-10-02 (×2): qty 20
  Filled 2017-10-02: qty 100
  Filled 2017-10-02: qty 20

## 2017-10-02 MED ORDER — NICOTINE 14 MG/24HR TD PT24
14.0000 mg | MEDICATED_PATCH | Freq: Every day | TRANSDERMAL | Status: DC
  Administered 2017-10-02 – 2017-10-03 (×2): 14 mg via TRANSDERMAL
  Filled 2017-10-02 (×2): qty 1

## 2017-10-02 MED ORDER — FAMOTIDINE 20 MG IN NS 100 ML IVPB
20.0000 mg | Freq: Two times a day (BID) | INTRAVENOUS | Status: DC
  Filled 2017-10-02 (×2): qty 100

## 2017-10-02 MED ORDER — METHYLPREDNISOLONE SODIUM SUCC 125 MG IJ SOLR
60.0000 mg | Freq: Two times a day (BID) | INTRAMUSCULAR | Status: DC
  Administered 2017-10-02 (×2): 60 mg via INTRAVENOUS
  Filled 2017-10-02 (×2): qty 2

## 2017-10-02 MED ORDER — METHYLPREDNISOLONE SODIUM SUCC 40 MG IJ SOLR: 40.0000 mg | Freq: Two times a day (BID) | INTRAMUSCULAR | Status: DC

## 2017-10-02 NOTE — Progress Notes (Signed)
[facility] [place] [facility]   PATIENT NAME: Brendan MinceCharles Tucker    MR#:  [id number]  DATE OF BIRTH:  [birth year]  SUBJECTIVE:  CHIEF COMPLAINT: Patient is feeling better but lower lip is still swollen and the tongue is with some swelling according to the girlfriend at bedside..  Patient denies any chest tightness acting swallowing okay  REVIEW OF SYSTEMS:  CONSTITUTIONAL: No fever, fatigue or weakness.  EYES: No blurred or double vision.  EARS, NOSE, AND THROAT: No tinnitus or ear pain.  RESPIRATORY: No cough, shortness of breath, wheezing or hemoptysis.  CARDIOVASCULAR: No chest pain, orthopnea, edema.  GASTROINTESTINAL: No nausea, vomiting, diarrhea or abdominal pain.  GENITOURINARY: No dysuria, hematuria.  ENDOCRINE: No polyuria, nocturia,  HEMATOLOGY: No anemia, easy bruising or bleeding SKIN: No rash or lesion. MUSCULOSKELETAL: No joint pain or arthritis.   NEUROLOGIC: No tingling, numbness, weakness.  PSYCHIATRY: No anxiety or depression.   DRUG ALLERGIES:   Allergies  Allergen Reactions  . Ativan [Lorazepam] Other (See Comments)    Causes agitation and aggressive  . Ketamine Other (See Comments)    hallucinations  . Lamictal [Lamotrigine]     Stomatitis, lip/facial swelling   . Biaxin [Clarithromycin] Nausea And Vomiting  . Sulfa Antibiotics Rash    VITALS:  Blood pressure (!) 102/53, pulse (!) 46, temperature 98.4 F (36.9 C), temperature source Oral, resp. rate 17, height 6\' 2"  (1.88 m), weight 71.6 kg (157 lb 14.4 oz), SpO2 96 %.  PHYSICAL EXAMINATION:  GENERAL:  22 y.o.-year-old patient lying in the bed with no acute distress.  EYES: Pupils equal, round, reactive to light and accommodation. No scleral icterus. Extraocular muscles intact.  HEENT: Head atraumatic, normocephalic. Oropharynx and nasopharynx clear, lower lip is edematous tongue is with some edema NECK:  Supple, no jugular venous distention. No thyroid enlargement, no  tenderness.  LUNGS: Normal breath sounds bilaterally, no wheezing, rales,rhonchi or crepitation. No use of accessory muscles of respiration.  CARDIOVASCULAR: S1, S2 normal. No murmurs, rubs, or gallops.  ABDOMEN: Soft, nontender, nondistended. Bowel sounds present. No organomegaly or mass.  EXTREMITIES: No pedal edema, cyanosis, or clubbing.  NEUROLOGIC: Cranial nerves II through XII are intact. Muscle strength 5/5 in all extremities. Sensation intact. Gait not checked.  PSYCHIATRIC: The patient is alert and oriented x 3.  SKIN: No obvious rash, lesion, or ulcer.    LABORATORY PANEL:   CBC Recent Labs  Lab 10/02/17 0426  WBC 10.3  HGB 15.0  HCT 44.4  PLT 249   ------------------------------------------------------------------------------------------------------------------  Chemistries  Recent Labs  Lab 10/02/17 0426  NA 137  K 4.8  CL 106  CO2 23  GLUCOSE 160*  BUN 15  CREATININE 1.05  CALCIUM 9.0   ------------------------------------------------------------------------------------------------------------------  Cardiac Enzymes No results for input(s): TROPONINI in the last 168 hours. ------------------------------------------------------------------------------------------------------------------  RADIOLOGY:  No results found.  EKG:   Orders placed or performed during the hospital encounter of 04/27/14  . EKG 12-Lead  . EKG 12-Lead    ASSESSMENT AND PLAN:    Angioedema -etiology unclear Patient is clinically improving  continue IV steroids, Pepcid and Claritin.   Swelling is no longer progressing.   Outpatient follow-up with ENT /allergy specialist is recommended  We will give him EpiPen prescription at the time of discharge  Regular diet as tolerated         All the records are reviewed and case discussed with Care Management/Social Workerr. Management plans discussed with the patient, family and they are  in agreement.  CODE STATUS:  fc  TOTAL TIME TAKING CARE OF THIS PATIENT: 36  minutes.   POSSIBLE D/C IN 1-2  DAYS, DEPENDING ON CLINICAL CONDITION.  Note: This dictation was prepared with Dragon dictation along with smaller phrase technology. Any transcriptional errors that result from this process are unintentional.   Ramonita Lab M.D on 10/02/2017 at 1:05 PM  Between 7am to 6pm - Pager - [phone] After 6pm go to [email or web address] - password [email or web address] [facility]  [place] [facility]  Office  [phone]  CC: Primary care physician; Henrine Screws, MD

## 2017-10-02 NOTE — Care Management Note (Signed)
Case Management Note  Patient Details  Name: Thelma CompCharles J Reardon MRN: [id number] Date of Birth: 04/13/1996  Subjective/Objective:     Provided Mr Elesa HackerDeaton with a discount coupon for EpiPen [facility]118[facility]. [facility] [employer] insurance may pay more than that for the EpiPen so he will have both and can use the least expensive option for his EpiPen.                 Action/Plan:   Expected Discharge Date:                  Expected Discharge Plan:     In-House Referral:     Discharge planning Services     Post Acute Care Choice:    Choice offered to:     DME Arranged:    DME Agency:     HH Arranged:    HH Agency:     Status of Service:     If discussed at [employer] Length of Stay Meetings, dates discussed:    Additional Comments:  Riley Papin A, RN 10/02/2017, 11:38 AM

## 2017-10-03 DIAGNOSIS — R569 Unspecified convulsions: Secondary | ICD-10-CM | POA: Diagnosis not present

## 2017-10-03 DIAGNOSIS — T783XXA Angioneurotic edema, initial encounter: Secondary | ICD-10-CM | POA: Diagnosis not present

## 2017-10-03 DIAGNOSIS — Z720 Tobacco use: Secondary | ICD-10-CM | POA: Diagnosis not present

## 2017-10-03 MED ORDER — EPINEPHRINE 0.3 MG/0.3ML IJ SOAJ: 0.3000 mg | Freq: Once | INTRAMUSCULAR | 1 refills | Status: AC

## 2017-10-03 MED ORDER — DIPHENHYDRAMINE HCL 25 MG PO CAPS: 25.0000 mg | ORAL_CAPSULE | Freq: Three times a day (TID) | ORAL | 0 refills | Status: DC | PRN

## 2017-10-03 MED ORDER — LORATADINE 10 MG PO TABS: 10.0000 mg | ORAL_TABLET | Freq: Every day | ORAL | 0 refills | Status: DC

## 2017-10-03 MED ORDER — DIPHENHYDRAMINE HCL 25 MG PO CAPS: 25.0000 mg | ORAL_CAPSULE | Freq: Three times a day (TID) | ORAL | Status: DC | PRN

## 2017-10-03 MED ORDER — FAMOTIDINE 20 MG PO TABS: 20.0000 mg | ORAL_TABLET | Freq: Two times a day (BID) | ORAL | 1 refills | Status: DC

## 2017-10-03 MED ORDER — NICOTINE 21 MG/24HR TD PT24: 21.0000 mg | MEDICATED_PATCH | Freq: Every day | TRANSDERMAL | 1 refills | Status: DC

## 2017-10-03 MED ORDER — PREDNISONE 10 MG (21) PO TBPK: 10.0000 mg | ORAL_TABLET | Freq: Every day | ORAL | 0 refills | Status: DC

## 2017-10-03 NOTE — Progress Notes (Signed)
Patient discharging home. VSS. IV removed. Instructions and prescription given to patient, verbalized understanding. Parents transporting patient home.

## 2017-10-03 NOTE — Discharge Instructions (Signed)
Follow-up with primary care physician in a week Use EpiPen injection for any kind of allergic reactions and come to the ED immediately Follow-up with primary neurologist in a week Quit smoking

## 2017-10-03 NOTE — Discharge Summary (Signed)
[facility] [place] NAME: Brendan Tucker    MR#:  [id number]  DATE OF BIRTH:  1996/05/02  DATE OF ADMISSION:  10/01/2017 ADMITTING PHYSICIAN: Lance Coon, MD  DATE OF DISCHARGE: 10/03/17  PRIMARY CARE PHYSICIAN: Aura Dials, MD    ADMISSION DIAGNOSIS:  Angioedema, initial encounter [T78.3XXA]  DISCHARGE DIAGNOSIS:  Principal Problem:   Angioedema   SECONDARY DIAGNOSIS:   Past Medical History:  Diagnosis Date  . Anxiety   . Complete tear of right ACL 08/13/2016  . Depression   . History of strep sore throat   . Seizures ([place])    last seizure Sept 2016  . 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 male who presents with swelling of his lips and tongue.  Patient states that this started earlier this evening, it was very sudden in onset.  Over the last several weeks he has had a couple of episodes of hives.  He is unable to identify any particular substance that may be causative.  He was given epinephrine and Solu-Medrol and Pepcid by EMS.  Hospitalist were called for admission  Angioedema -etiology unclear Patient is clinically improving , almost back to normal and wants to go home Taper steroids, Pepcid and Claritin.  Benadryl as needed Swelling is no longer progressing.  Outpatient follow-up with ENT /allergy specialist is recommended  We will give him EpiPen prescription at the time of discharge  Regular diet as tolerated   #History of epilepsy Outpatient follow-up with primary neurologist as recommended or in a week  #Tobacco abuse disorder Counseled patient to quit smoking for 5 minutes, he will try nicotine patches  DISCHARGE CONDITIONS:  Stable  CONSULTS OBTAINED:  Treatment Team:  Alexis Goodell, MD   PROCEDURES none  DRUG ALLERGIES:   Allergies  Allergen Reactions  . Ativan [Lorazepam] Other (See Comments)     Causes agitation and aggressive  . Ketamine Other (See Comments)    hallucinations  . Lamictal [Lamotrigine]     Stomatitis, lip/facial swelling   . Biaxin [Clarithromycin] Nausea And Vomiting  . Sulfa Antibiotics Rash    DISCHARGE MEDICATIONS:   Allergies as of 10/03/2017      Reactions   Ativan [lorazepam] Other (See Comments)   Causes agitation and aggressive   Ketamine Other (See Comments)   hallucinations   Lamictal [lamotrigine]    Stomatitis, lip/facial swelling    Biaxin [clarithromycin] Nausea And Vomiting   Sulfa Antibiotics Rash      Medication List    TAKE these medications   diphenhydrAMINE 25 mg capsule Commonly known as:  BENADRYL Take 1 capsule (25 mg total) by mouth every 8 (eight) hours as needed for itching or allergies.   EPINEPHrine 0.3 mg/0.3 mL Soaj injection Commonly known as:  EPI-PEN Inject 0.3 mLs (0.3 mg total) into the muscle once for 1 dose.   famotidine 20 MG tablet Commonly known as:  PEPCID Take 1 tablet (20 mg total) by mouth 2 (two) times daily.   loratadine 10 MG tablet Commonly known as:  CLARITIN Take 1 tablet (10 mg total) by mouth daily.   nicotine 21 mg/24hr patch Commonly known as:  NICODERM CQ - dosed in mg/24 hours Place 1 patch (21 mg total) onto the skin daily. Start taking on:  10/04/2017   predniSONE 10 MG (21) Tbpk tablet Commonly known as:  STERAPRED UNI-PAK  21 TAB Take 1 tablet (10 mg total) by mouth daily. Take 6 tablets by mouth for 1 day followed by  5 tablets by mouth for 1 day followed by  4 tablets by mouth for 1 day followed by  3 tablets by mouth for 1 day followed by  2 tablets by mouth for 1 day followed by  1 tablet by mouth for a day and stop        DISCHARGE INSTRUCTIONS:   Follow-up with primary care physician in a week Use EpiPen injection for any kind of allergic reactions and come to the ED immediately Follow-up with primary neurologist in a week Quit smoking  DIET:  Regular  diet  DISCHARGE CONDITION:  Stable  ACTIVITY:  Activity as tolerated  OXYGEN:  Home Oxygen: No.   Oxygen Delivery: room air  DISCHARGE LOCATION:  home   If you experience worsening of your admission symptoms, develop shortness of breath, life threatening emergency, suicidal or homicidal thoughts you must seek medical attention immediately by calling 911 or calling your MD immediately  if symptoms less severe.  You Must read complete instructions/literature along with all the possible adverse reactions/side effects for all the Medicines you take and that have been prescribed to you. Take any new Medicines after you have completely understood and accpet all the possible adverse reactions/side effects.   Please note  You were cared for by a hospitalist during your hospital stay. If you have any questions about your discharge medications or the care you received while you were in the hospital after you are discharged, you can call the unit and asked to speak with the hospitalist on call if the hospitalist that took care of you is not available. Once you are discharged, your primary care physician will handle any further medical issues. Please note that NO REFILLS for any discharge medications will be authorized once you are discharged, as it is imperative that you return to your primary care physician (or establish a relationship with a primary care physician if you do not have one) for your aftercare needs so that they can reassess your need for medications and monitor your lab values.     Today  Chief Complaint  Patient presents with  . Allergic Reaction   Patient is doing much better.  Denies any chest tightness.  Swallowing fine.  Parents at bedside.  Patient wants to go home.  ROS:  CONSTITUTIONAL: Denies fevers, chills. Denies any fatigue, weakness.  EYES: Denies blurry vision, double vision, eye pain. EARS, NOSE, THROAT: Denies tinnitus, ear pain, hearing loss. RESPIRATORY:  Denies cough, wheeze, shortness of breath.  CARDIOVASCULAR: Denies chest pain, palpitations, edema.  GASTROINTESTINAL: Denies nausea, vomiting, diarrhea, abdominal pain. Denies bright red blood per rectum. GENITOURINARY: Denies dysuria, hematuria. ENDOCRINE: Denies nocturia or thyroid problems. HEMATOLOGIC AND LYMPHATIC: Denies easy bruising or bleeding. SKIN: Denies rash or lesion. MUSCULOSKELETAL: Denies pain in neck, back, shoulder, knees, hips or arthritic symptoms.  NEUROLOGIC: Denies paralysis, paresthesias.  PSYCHIATRIC: Denies anxiety or depressive symptoms.   VITAL SIGNS:  Blood pressure (!) 96/54, pulse (!) 56, temperature 98.2 F (36.8 C), temperature source Oral, resp. rate 18, height _0  (1.88 m), weight 71.6 kg (157 lb 14.4 oz), SpO2 99 %.  I/O:    Intake/Output Summary (Last 24 hours) at 10/03/2017 1227 Last data filed at 10/03/2017 0734 Gross per 24 hour  Intake 2286.67 ml  Output -  Net 2286.67 ml    PHYSICAL EXAMINATION:  GENERAL:  22 y.o.-year-old  patient lying in the bed with no acute distress.  EYES: Pupils equal, round, reactive to light and accommodation. No scleral icterus. Extraocular muscles intact.  HEENT: Head atraumatic, normocephalic. Oropharynx and nasopharynx clear.  Decreased tongue and lower lip swelling almost back to normal NECK:  Supple, no jugular venous distention. No thyroid enlargement, no tenderness.  LUNGS: Normal breath sounds bilaterally, no wheezing, rales,rhonchi or crepitation. No use of accessory muscles of respiration.  CARDIOVASCULAR: S1, S2 normal. No murmurs, rubs, or gallops.  ABDOMEN: Soft, non-tender, non-distended. Bowel sounds present. No organomegaly or mass.  EXTREMITIES: No pedal edema, cyanosis, or clubbing.  NEUROLOGIC: Cranial nerves II through XII are intact. Muscle strength 5/5 in all extremities. Sensation intact. Gait not checked.  PSYCHIATRIC: The patient is alert and oriented x 3.  SKIN: No obvious rash,  lesion, or ulcer.   DATA REVIEW:   CBC Recent Labs  Lab 10/02/17 0426  WBC 10.3  HGB 15.0  HCT 44.4  PLT 249    Chemistries  Recent Labs  Lab 10/02/17 0426  NA 137  K 4.8  CL 106  CO2 23  GLUCOSE 160*  BUN 15  CREATININE 1.05  CALCIUM 9.0    Cardiac Enzymes No results for input(s): TROPONINI in the last 168 hours.  Microbiology Results  Results for orders placed or performed during the hospital encounter of 04/27/14  Respiratory virus panel (routine influenza)     Status: None   Collection Time: 04/27/14  9:52 PM  Result Value Ref Range Status   Source - RVPAN NASAL SWAB  Corrected    Comment: CORRECTED ON 09/21 AT 2053: PREVIOUSLY REPORTED AS NASAL SWAB   Respiratory Syncytial Virus A NOT DETECTED  Final   Respiratory Syncytial Virus B NOT DETECTED  Final   Influenza A NOT DETECTED  Final   Influenza B NOT DETECTED  Final   Parainfluenza 1 NOT DETECTED  Final   Parainfluenza 2 NOT DETECTED  Final   Parainfluenza 3 NOT DETECTED  Final   Metapneumovirus NOT DETECTED  Final   Rhinovirus NOT DETECTED  Final   Adenovirus NOT DETECTED  Final   Influenza A H1 NOT DETECTED  Final   Influenza A H3 NOT DETECTED  Final    Comment: (NOTE)       Normal Reference Range for each Analyte: NOT DETECTED Testing performed using the Luminex xTAG Respiratory Viral Panel test kit. The analytical performance characteristics of this assay have been determined by [employer].  The modifications have not been cleared or approved by the FDA. This assay has been validated pursuant to the CLIA regulations and is used for clinical purposes. Performed at [facility]:  Dg Chest 2 View  Result Date: 10/02/2017 CLINICAL DATA:  Cough with continued tongue and lip swelling. Pt was admitted yesterday for anaphalatic reaction with unknown cause. Pt has had several episodes of hives over the last month. Hx - Steven-Johsnon syndrom 2016, current smoker 1 ppd  EXAM: CHEST  2 VIEW COMPARISON:  04/27/2014 FINDINGS: The heart size and mediastinal contours are within normal limits. Both lungs are clear. No pleural effusion or pneumothorax. The visualized skeletal structures are unremarkable. IMPRESSION: No active cardiopulmonary disease. Electronically Signed   By: Lajean Manes M.D.   On: 10/02/2017 19:07    EKG:   Orders placed or performed during the hospital encounter of 04/27/14  . EKG 12-Lead  . EKG 12-Lead      Management plans discussed with  the patient, family and they are in agreement.  CODE STATUS:     Code Status Orders  (From admission, onward)        Start     Ordered   10/02/17 0020  Full code  Continuous     10/02/17 0019    Code Status History    Date Active Date Inactive Code Status Order ID Comments User Context   08/13/2016 18:37 08/14/2016 12:09 Full Code [id number]  Marchia Bond, MD Inpatient   04/27/2014 19:33 05/01/2014 16:22 Full Code [id number]  Cresenciano Genre Inpatient      TOTAL TIME TAKING CARE OF THIS PATIENT: 45 minutes.   Note: This dictation was prepared with Dragon dictation along with smaller phrase technology. Any transcriptional errors that result from this process are unintentional.   _0 @  on 10/03/2017 at 12:27 PM  Between 7am to 6pm - Pager - [phone]  After 6pm go to [email or web address] - password [email or web address] [facility]  [facility]  Office  [phone]  CC: Primary care physician; Aura Dials, MD

## 2017-10-04 LAB — HIV ANTIBODY (ROUTINE TESTING W REFLEX): HIV Screen 4th Generation wRfx: NONREACTIVE

## 2017-12-29 DIAGNOSIS — R05 Cough: Secondary | ICD-10-CM | POA: Diagnosis not present

## 2018-03-03 IMAGING — CR DG CHEST 2V
2 series · 2 of 2 positions shown · non-contrast
Comparison: 04/27/2014

CLINICAL DATA: Cough with continued tongue and lip swelling. Pt was
admitted yesterday for anaphalatic reaction with unknown cause. Pt
has had several episodes of hives over the last month. Hx -
Beci syndrom [id number], current smoker 1 ppd

EXAM:
CHEST  2 VIEW

[chest pa]
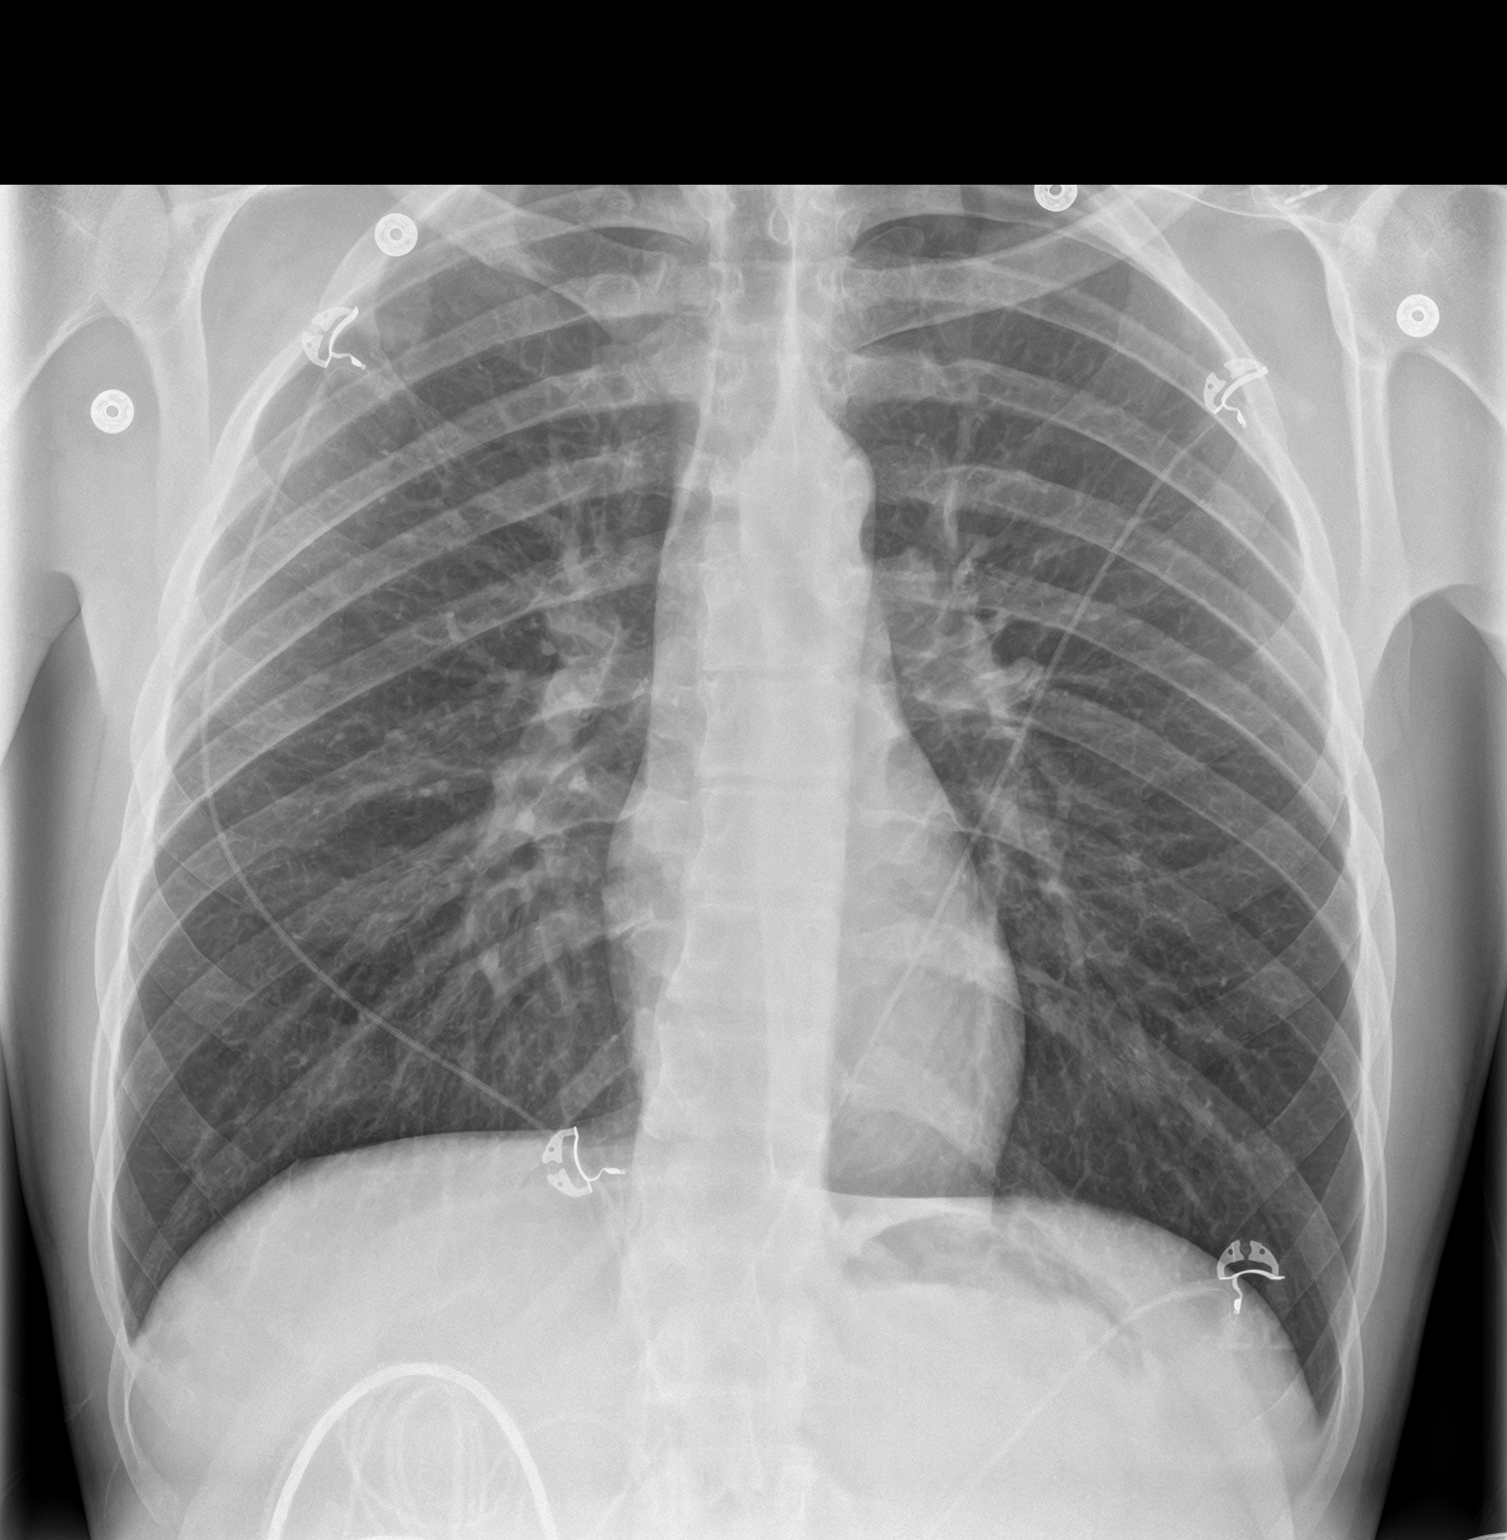

[chest lat]
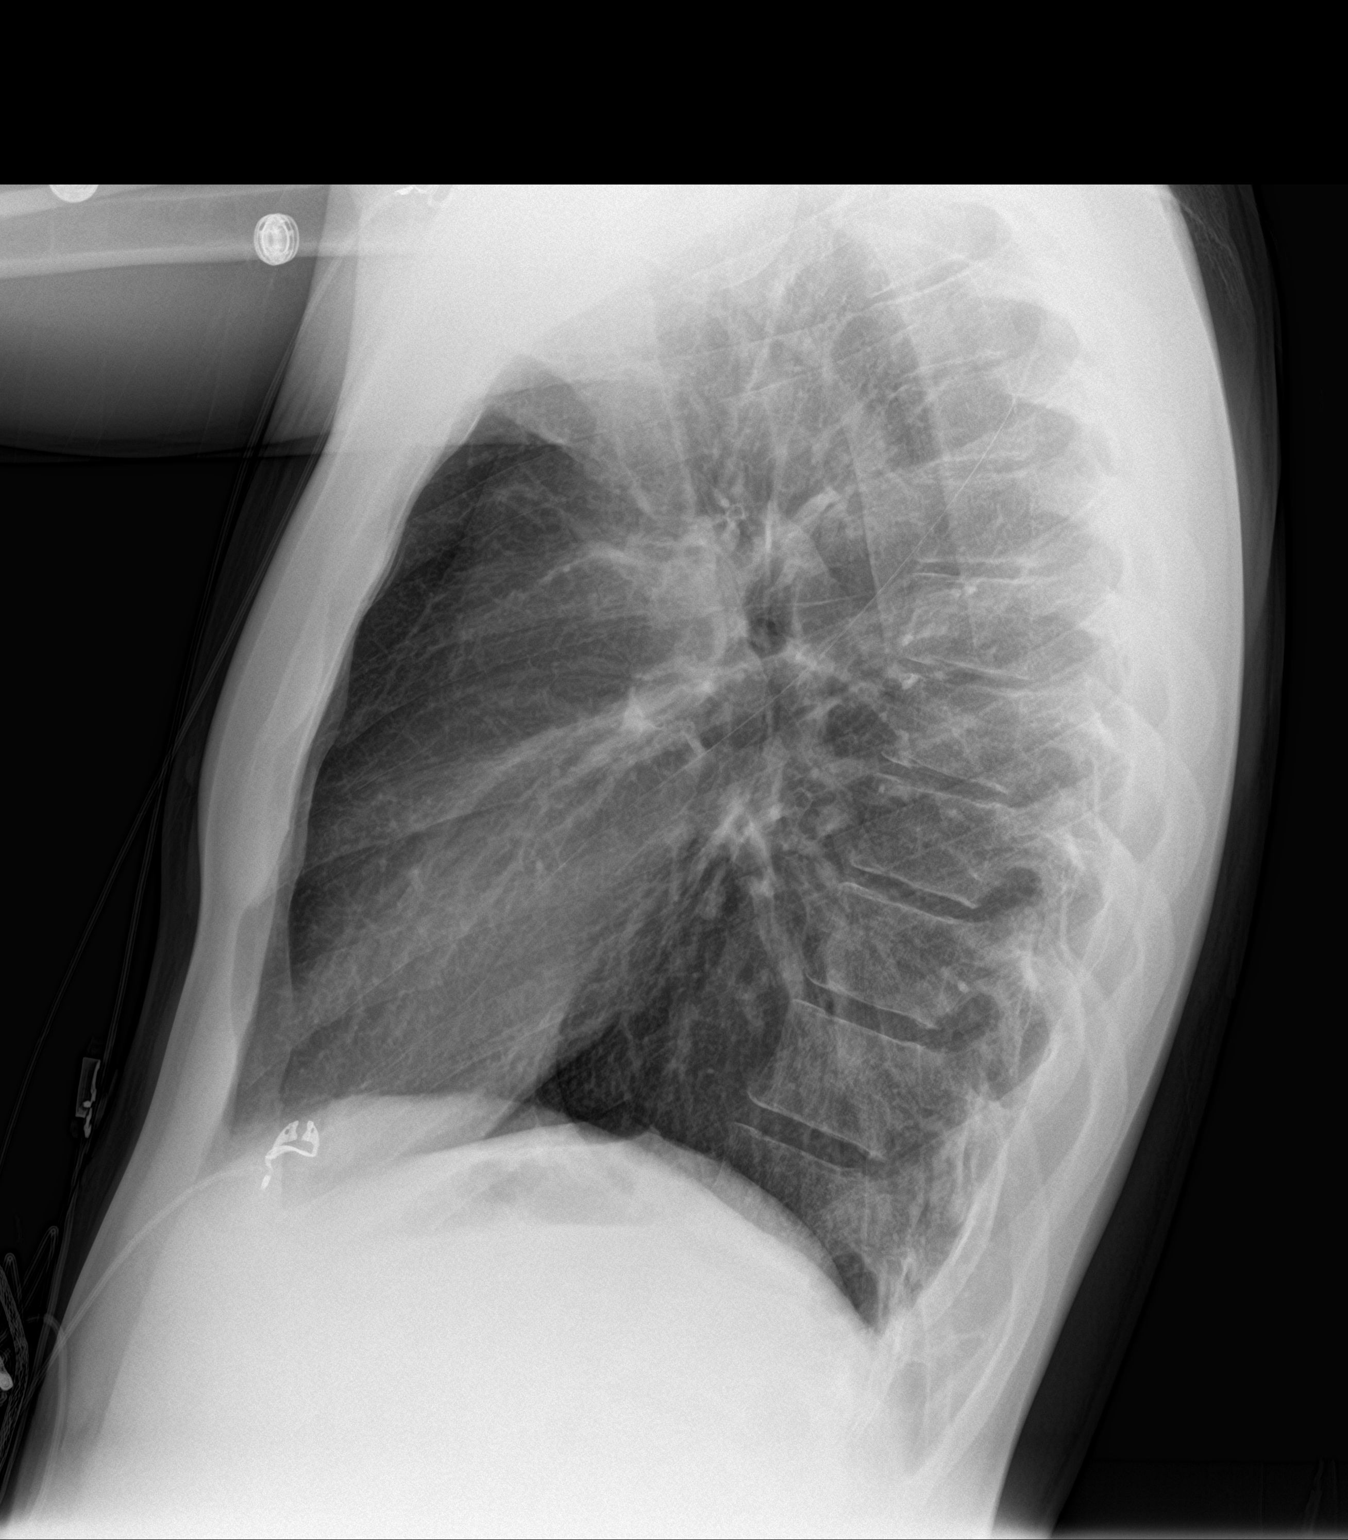

[2 of 2 positions shown; findings below may reference images not displayed]

FINDINGS: The heart size and mediastinal contours are within normal limits.
Both lungs are clear. No pleural effusion or pneumothorax. The
visualized skeletal structures are unremarkable.
IMPRESSION: No active cardiopulmonary disease.

## 2018-08-24 ENCOUNTER — Ambulatory Visit (INDEPENDENT_AMBULATORY_CARE_PROVIDER_SITE_OTHER): Payer: 59

## 2018-08-24 ENCOUNTER — Ambulatory Visit (HOSPITAL_COMMUNITY)
Admission: EM | Admit: 2018-08-24 | Discharge: 2018-08-24 | Disposition: A | Discharge status: Home / Self Care | Payer: 59 | Attending: Family Medicine | Admitting: Family Medicine

## 2018-08-24 ENCOUNTER — Encounter (HOSPITAL_COMMUNITY): Payer: Self-pay | Admitting: Emergency Medicine

## 2018-08-24 ENCOUNTER — Other Ambulatory Visit: Payer: Self-pay

## 2018-08-24 DIAGNOSIS — S60221A Contusion of right hand, initial encounter: Secondary | ICD-10-CM | POA: Diagnosis not present

## 2018-08-24 DIAGNOSIS — M79641 Pain in right hand: Secondary | ICD-10-CM | POA: Diagnosis not present

## 2018-08-24 MED ORDER — DICLOFENAC SODIUM 75 MG PO TBEC: 75.0000 mg | DELAYED_RELEASE_TABLET | Freq: Two times a day (BID) | ORAL | 0 refills | Status: DC

## 2018-08-24 MED ORDER — TRAMADOL HCL 50 MG PO TABS: 50.0000 mg | ORAL_TABLET | Freq: Four times a day (QID) | ORAL | 0 refills | Status: DC | PRN
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
## 2018-11-17 DIAGNOSIS — T782XXA Anaphylactic shock, unspecified, initial encounter: Secondary | ICD-10-CM | POA: Diagnosis not present

## 2018-11-17 DIAGNOSIS — T7840XA Allergy, unspecified, initial encounter: Secondary | ICD-10-CM | POA: Diagnosis not present

## 2018-12-08 ENCOUNTER — Encounter: Payer: Self-pay | Admitting: Allergy

## 2018-12-08 ENCOUNTER — Ambulatory Visit (INDEPENDENT_AMBULATORY_CARE_PROVIDER_SITE_OTHER): Payer: 59 | Admitting: Allergy

## 2018-12-08 ENCOUNTER — Other Ambulatory Visit: Payer: Self-pay

## 2018-12-08 VITALS — BP 90/70 | HR 93 | Resp 16 | Ht 75.0 in | Wt 154.0 lb

## 2018-12-08 DIAGNOSIS — T7840XD Allergy, unspecified, subsequent encounter: Secondary | ICD-10-CM | POA: Diagnosis not present

## 2018-12-08 DIAGNOSIS — H1013 Acute atopic conjunctivitis, bilateral: Secondary | ICD-10-CM | POA: Insufficient documentation

## 2018-12-08 DIAGNOSIS — R0602 Shortness of breath: Secondary | ICD-10-CM | POA: Diagnosis not present

## 2018-12-08 DIAGNOSIS — Z720 Tobacco use: Secondary | ICD-10-CM | POA: Diagnosis not present

## 2018-12-08 DIAGNOSIS — J3089 Other allergic rhinitis: Secondary | ICD-10-CM

## 2018-12-08 DIAGNOSIS — T7840XA Allergy, unspecified, initial encounter: Secondary | ICD-10-CM | POA: Insufficient documentation

## 2018-12-08 MED ORDER — FLUTICASONE PROPIONATE 50 MCG/ACT NA SUSP: NASAL | 5 refills | Status: AC

## 2018-12-08 MED ORDER — OLOPATADINE HCL 0.2 % OP SOLN: 1.0000 [drp] | Freq: Every day | OPHTHALMIC | 5 refills | Status: AC | PRN

## 2018-12-08 NOTE — Assessment & Plan Note (Signed)
Rhinoconjunctivitis symptoms mainly during the springtime.  Currently on Allegra 180 mg daily with good benefit.  Today skin testing was positive to grass, weed, ragweed, trees, tobacco, cockroach, fire ant.  Start environmental control measures.   Continue allegra 180mg  daily.  May use Flonase 1-2 sprays daily if needed.  May use Patanol 1 drop in each eye daily as needed for itchy/watery eyes.

## 2018-12-08 NOTE — Patient Instructions (Addendum)
Today's skin testing showed: Positive to grass, weed, ragweed, trees, tobacco, cockroach, fire ant.  Positive to peanut, soy, sesame.    Get bloodwork - CBC diff, ESR, CMP, C3, C4, tryptase, ANA with reflex, thyroid cascade, hymenoptera panel, alpha gal, Peanut component, peach, soy, sesame.   Allergic rhinitis:  Start environmental control measures.   STOP smoking tobacco  Continue allegra 116m daily.  May use Flonase 1-2 sprays daily if needed.  May use Patanol 1 drop in each eye daily as needed for itchy/watery eyes.   Allergic reaction:   Do not use any peach flavored cigar wrapper.  Avoid peanuts, peach, soy and sesame for now.   I have prescribed epinephrine injectable and demonstrated proper use. For mild symptoms you can take over the counter antihistamines such as Benadryl and monitor symptoms closely. If symptoms worsen or if you have severe symptoms including breathing issues, throat closure, significant swelling, whole body hives, severe diarrhea and vomiting, lightheadedness then inject epinephrine and seek immediate medical care afterwards.  Emergency action plan given.   Consider allergy injections for fire ant.   Follow up in 2 months.  Reducing Pollen Exposure . Pollen seasons: trees (spring), grass (summer) and ragweed/weeds (fall). .Marland KitchenKeep windows closed in your home and car to lower pollen exposure.  .Susa Simmondsair conditioning in the bedroom and throughout the house if possible.  . Avoid going out in dry windy days - especially early morning. . Pollen counts are highest between 5 - 10 AM and on dry, hot and windy days.  . Save outside activities for late afternoon or after a heavy rain, when pollen levels are lower.  . Avoid mowing of grass if you have grass pollen allergy. .Marland KitchenBe aware that pollen can also be transported indoors on people and pets.  . Dry your clothes in an automatic dryer rather than hanging them outside where they might collect pollen.   . Rinse hair and eyes before bedtime.  Cockroach Allergen Avoidance Cockroaches are often found in the homes of densely populated urban areas, schools or commercial buildings, but these creatures can lurk almost anywhere. This does not mean that you have a dirty house or living area. . Block all areas where roaches can enter the home. This includes crevices, wall cracks and windows.  . Cockroaches need water to survive, so fix and seal all leaky faucets and pipes. Have an exterminator go through the house when your family and pets are gone to eliminate any remaining roaches. .Marland KitchenKeep food in lidded containers and put pet food dishes away after your pets are done eating. Vacuum and sweep the floor after meals, and take out garbage and recyclables. Use lidded garbage containers in the kitchen. Wash dishes immediately after use and clean under stoves, refrigerators or toasters where crumbs can accumulate. Wipe off the stove and other kitchen surfaces and cupboards regularly.

## 2018-12-08 NOTE — Assessment & Plan Note (Signed)
Current tobacco smoker with episodes of SOB during these anaphylactic reactions. No prior history of asthma/copd diagnosis.  Today's spirometry was normal.  Monitor symptoms.  Discussed smoking cessation.

## 2018-12-08 NOTE — Assessment & Plan Note (Addendum)
2 episodes of allergic reactions.  First episode occurred 1 year ago after smoking marijuana in a peach flavored cigar wrap.  Patient had loss of consciousness and had to be taken to the ER via ambulance where he was hospitalized for 3 days.  He had this kind of marijuana before with no issues.  Still smoking marijuana on a daily basis for his "seizures" with no additional reactions. He also smokes regular cigarettes 1ppd. Second episode occurred after being outdoors for prolonged period of time 1 month ago. He had whole body hives and trouble breathing. Self injected epinephrine and took benadryl. Symptoms improved within 30 minutes. Denies any insect/bee stings. No changes in diet, medications or personal care products.   Today's skin testing showed: Positive to grass, weed, ragweed, trees, tobacco, cockroach, fire ant. Positive to peanut, soy, sesame.   Patient eats peanuts, soy and sesame with no issues. Recommend avoidance for now and if bloodwork negative will discuss reintroduction at next visit.   Based on clinical history and test results concerned about potential fire ant causing the anaphylaxis as both episodes occurred during the warmer months.  Get bloodwork as below to further investigate this.   Do not use any peach flavored cigar wrapper.  I have prescribed epinephrine injectable and demonstrated proper use. For mild symptoms you can take over the counter antihistamines such as Benadryl and monitor symptoms closely. If symptoms worsen or if you have severe symptoms including breathing issues, throat closure, significant swelling, whole body hives, severe diarrhea and vomiting, lightheadedness then inject epinephrine and seek immediate medical care afterwards.  Emergency action plan given.   Consider allergy injections for fire ant.

## 2018-12-08 NOTE — Progress Notes (Signed)
New Patient Note  RE: Brendan Tucker MRN: [id number] DOB: November 26, 1995 Date of Office Visit: 12/08/2018  Referring provider: Vernie Shanks, MD Primary care provider: Vernie Shanks, MD  Chief Complaint: Allergic Rhinitis ; Urticaria; and Food Intolerance  History of Present Illness: I had the pleasure of seeing Brendan Tucker for initial evaluation at the [facility] on 12/08/2018. He is a 23 y.o. male, who is referred here by Vernie Shanks, MD for the evaluation of allergic reaction.   Allergic reaction: First reaction occurred about 1 year ago. 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 In 15-20 minutes he had throat swelling, facial swelling and whole body urticaria. This happened in the car. No insect/bug bites. He had LOC and was admitted to the hospital for 3 days. Symptoms resolved within 2 days and did not require intubation. Patient has used this particular peach flavored wrap before with no issues. [place]   Still smoking marijuana but stopped using the flavored wraps.  The second reaction occurred about 1 month ago. Patient was riding his four wheeler and broke out in whole body hives at the end of the day. He was trying to take a shower and felt like he couldn't breathe. He self-injected with IM epinephrine and benadryl which helped within 30 minutes. Patient went to bed and woke up with no issues.  Patient has history of hives for the past few years with no triggers noted.  Individual rashes lasts about a few hours. No ecchymosis upon resolution. Denies any fevers, chills, changes in medications, foods, personal care products or recent infections. He has tried the following therapies: benadryl and epinephrine with good benefit. Systemic steroids yes. Currently on allegra 140m daily.  Previous work up includes: none.  Last seizure was 8 months ago. He does not  follow with neurologist.   Patient eats peanuts, soy and sesame with no issues.   Assessment and Plan: CCashisis a 23y.o. male with: Allergic reaction 2 episodes of allergic reactions.  First episode occurred 1 year ago after smoking marijuana in a peach flavored cigar wrap.  Patient had loss of consciousness and had to be taken to the ER via ambulance where he was hospitalized for 3 days.  He had this kind of marijuana before with no issues.  Still smoking marijuana on a daily basis for his "seizures" with no additional reactions. He also smokes regular cigarettes 1ppd. Second episode occurred after being outdoors for prolonged period of time 1 month ago. He had whole body hives and trouble breathing. Self injected epinephrine and took benadryl. Symptoms improved within 30 minutes. Denies any insect/bee stings. No changes in diet, medications or personal care products.   Today's skin testing showed: Positive to grass, weed, ragweed, trees, tobacco, cockroach, fire ant. Positive to peanut, soy, sesame.   Patient eats peanuts, soy and sesame with no issues. Recommend avoidance for now and if bloodwork negative will discuss reintroduction at next visit.   Based on clinical history and test results concerned about potential fire ant causing the anaphylaxis as both episodes occurred during the warmer months.  Get bloodwork as below to further investigate this.   Do not use any peach flavored cigar wrapper.  I have prescribed epinephrine injectable and demonstrated proper use. For mild symptoms you can take over the counter antihistamines such as Benadryl and monitor symptoms closely. If  symptoms worsen or if you have severe symptoms including breathing issues, throat closure, significant swelling, whole body hives, severe diarrhea and vomiting, lightheadedness then inject epinephrine and seek immediate medical care afterwards.  Emergency action plan given.   Consider allergy injections for fire  ant.   Shortness of breath Current tobacco smoker with episodes of SOB during these anaphylactic reactions. No prior history of asthma/copd diagnosis.  Today's spirometry was normal.  Monitor symptoms.  Discussed smoking cessation.   Other allergic rhinitis Rhinoconjunctivitis symptoms mainly during the springtime.  Currently on Allegra 180 mg daily with good benefit.  Today skin testing was positive to grass, weed, ragweed, trees, tobacco, cockroach, fire ant.  Start environmental control measures.   Continue allegra 145m daily.  May use Flonase 1-2 sprays daily if needed.  May use Patanol 1 drop in each eye daily as needed for itchy/watery eyes.   Allergic conjunctivitis of both eyes  See assessment and plan as above for allergic rhinitis.  Return in about 2 months (around 02/07/2019).  Meds ordered this encounter  Medications  . fluticasone (FLONASE) 50 MCG/ACT nasal spray    Sig: Use 1-2 sprays daily as needed.    Dispense:  16 g    Refill:  5  . Olopatadine HCl 0.2 % SOLN    Sig: Apply 1 drop to eye daily as needed (itchy/watery eyes).    Dispense:  1 Bottle    Refill:  5    Lab Orders     CBC With Differential     Sed Rate (ESR)     Comprehensive metabolic panel     C3 and C4     Tryptase     ANA w/Reflex     Thyroid Cascade Profile     Allergen Hymenoptera Panel     Alpha-Gal Panel     IgE Peanut w/Component Reflex     Allergen Peach f95     Soybean IgE     Allergen Sesame f10  Other allergy screening: Asthma: no Rhino conjunctivitis: yes  He reports symptoms of rhinorrhea, nasal congestion, itchy/watery eyes. Symptoms have been going on for few months. The symptoms are present during the spring. He has used aHuman resources officerwith fair improvement in symptoms.  Previous work up includes: none. Food allergy: no Medication allergy: yes  Ativan - agitated ketamine - hallucinations Lamictal - swelling Sulfa - rash Biaxin - GI symptoms Hymenoptera allergy:   Large localized swelling Eczema:no History of recurrent infections suggestive of immunodeficency: no  Diagnostics: Spirometry:  Tracings reviewed. His effort: It was hard to get consistent efforts and there is a question as to whether this reflects a maximal maneuver. FVC: 6.52L FEV1: 5.43L, 103% predicted FEV1/FVC ratio: 83% Interpretation: Spirometry consistent with normal pattern.  Please see scanned spirometry results for details.  Skin Testing: Environmental allergy panel and select foods. Positive test to: grass, weed, ragweed, trees, tobacco, cockroach, fire ant, peanut, soy and sesame. Results discussed with patient/family. Airborne Adult Perc - 12/08/18 1508    Time Antigen Placed  1508    Allergen Manufacturer  GLavella Hammock   Location  Back    Number of Test  59    Panel 1  Select    1. Control-Buffer 50% Glycerol  Negative    2. Control-Histamine 1 mg/ml  3+    3. Albumin saline  Negative    4. [place] 4+    5. B[place] 4+    6. Johnson  3+    7.  Choctaw Blue  Negative    8. Meadow Fescue  Negative    9. Perennial Rye  Negative    10. Sweet Vernal  Negative    11. Timothy  Negative    12. Cocklebur  2+    13. Burweed Marshelder  Negative    14. Ragweed, short  2+    15. Ragweed, Giant  2+    16. Plantain,  English  2+    17. Lamb's Quarters  Negative    18. Sheep Sorrell  Negative    19. Rough Pigweed  2+    20. Marsh Elder, Rough  Negative    21. Mugwort, Common  3+    22. Ash mix  Negative    23. Birch mix  3+    24. Beech American  3+    25. Box, Elder  2+    26. Cedar, red  Negative    27. Cottonwood, Eastern  2+    28. Elm mix  3+    29. Hickory mix  2+    30. Maple mix  Negative    31. Oak, [place] mix  2+    32. Pecan Pollen  2+    33. Pine mix  Negative    34. Sycamore Eastern  3+    35. Kent, Black Pollen  3+    36. Alternaria alternata  Negative    37. Cladosporium Herbarum  Negative    38. Aspergillus mix  Negative    39. Penicillium mix   Negative    40. Bipolaris sorokiniana (Helminthosporium)  Negative    41. Drechslera spicifera (Curvularia)  Negative    42. Mucor plumbeus  Negative    43. Fusarium moniliforme  Negative    44. Aureobasidium pullulans (pullulara)  Negative    45. Rhizopus oryzae  Negative    46. Botrytis cinera  Negative    47. Epicoccum nigrum  Negative    48. Phoma betae  Negative    49. Candida Albicans  Negative    50. Trichophyton mentagrophytes  Negative    51. Mite, D Farinae  5,000 AU/ml  Negative    52. Mite, D Pteronyssinus  5,000 AU/ml  Negative    53. Cat Hair 10,000 BAU/ml  Negative    54.  Dog Epithelia  Negative    55. Mixed Feathers  Negative    56. Horse Epithelia  Negative    57. Cockroach, German  2+    58. Mouse  Negative    59. Tobacco Leaf  3+     Intradermal - 12/08/18 1554    Time Antigen Placed  1554    Allergen Manufacturer  Lavella Hammock    Location  Arm    Number of Test  8    Intradermal  Select    Control  Negative    Mold 1  Negative    Mold 2  Negative    Mold 3  Negative    Mold 4  Negative    Cat  Negative    Dog  Negative    Mite mix  Negative     Food Adult Perc - 12/08/18 1500    Time Antigen Placed  1509    Allergen Manufacturer  Lavella Hammock    Location  Back    Number of allergen test  11    1. Peanut  --   3x3   2. Soybean  --   3x3   3. Wheat  Negative  4. Sesame  --   3x3   5. Milk, cow  Negative    6. Egg White, Chicken  Negative    7. Casein  Negative    8. Shellfish Mix  Negative    9. Fish Mix  Negative    10. Cashew  Negative    59. Peach  Negative     Venom Testing - 12/08/18 1510    Time Antigen Placed  1511    Allergen Manufacturer  Lavella Hammock    Location  Back    Number of Test  1    Select  Select    Fire Ant Puncture  3+       Past Medical History: Patient Active Problem List   Diagnosis Date Noted  . Allergic reaction 12/08/2018  . Shortness of breath 12/08/2018  . Other allergic rhinitis 12/08/2018  . Allergic  conjunctivitis of both eyes 12/08/2018  . Angioedema 10/01/2017  . Complete tear of right ACL 08/13/2016  . Right ACL tear 08/13/2016  . Medication reaction 04/29/2014  . Seizure ([place]) 04/27/2014  . Starvation ketoacidosis 04/27/2014  . Musculoskeletal chest pain 04/27/2014  . Tobacco user 04/27/2014  . Marijuana use 04/27/2014  . Stomatitis 04/27/2014   Past Medical History:  Diagnosis Date  . Anxiety   . Complete tear of right ACL 08/13/2016  . Depression   . History of strep sore throat   . Seizures ([place])    last seizure Sept 2016  . 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 DENTAL SURGERY     abscess in mouth  . KNEE ARTHROSCOPY WITH ANTERIOR CRUCIATE LIGAMENT (ACL) REPAIR WITH HAMSTRING GRAFT Right 08/13/2016   Procedure: RIGHT KNEE ARTHROSCOPY,ANTERIOR CRUCIATE [place] AUTOGRAFT HAMSTRING;  Surgeon: Marchia Bond, MD;  Location: [facility];  Service: Orthopedics;  Laterality: Right;  . SHOULDER SURGERY     left  . WISDOM TOOTH EXTRACTION     Medication List:  Current Outpatient Medications  Medication Sig Dispense Refill  . Fexofenadine HCl (ALLEGRA ALLERGY PO) Take by mouth.    . EPINEPHrine 0.3 mg/0.3 mL IJ SOAJ injection See admin instructions.    . fluticasone (FLONASE) 50 MCG/ACT nasal spray Use 1-2 sprays daily as needed. 16 g 5  . montelukast (SINGULAIR) 10 MG tablet     . Olopatadine HCl 0.2 % SOLN Apply 1 drop to eye daily as needed (itchy/watery eyes). 1 Bottle 5   No current facility-administered medications for this visit.    Allergies: Allergies  Allergen Reactions  . Ativan [Lorazepam] Other (See Comments)    Causes agitation and aggressive  . Ketamine Other (See Comments)    hallucinations  . Lamictal [Lamotrigine]     Stomatitis, lip/facial swelling   . Biaxin [Clarithromycin] Nausea And Vomiting  . Sulfa Antibiotics Rash    Social History: Social History   Socioeconomic History  . Marital status: Single    Spouse name: Not on file  . Number of children: Not on file  . Years of education: Not on file  . Highest education level: Not on file  Occupational History  . Not on file  Social Needs  . Financial resource strain: Not on file  . Food insecurity:    Worry: Not on file    Inability: Not on file  . Transportation needs:    Medical: Not on file    Non-medical: Not on file  Tobacco  Use  . Smoking status: Current Every Day Smoker    Packs/day: 1.00    Types: Cigarettes  . Smokeless tobacco: Never Used  Substance and Sexual Activity  . Alcohol use: Yes    Comment: occasionally  . Drug use: Yes    Types: Marijuana  . Sexual activity: Not on file  Lifestyle  . Physical activity:    Days per week: Not on file    Minutes per session: Not on file  . Stress: Not on file  Relationships  . Social connections:    Talks on phone: Not on file    Gets together: Not on file    Attends religious service: Not on file    Active member of club or organization: Not on file    Attends meetings of clubs or organizations: Not on file    Relationship status: Not on file  Other Topics Concern  . Not on file  Social History Narrative   Reports tobacco abuse 1/2 ppd since age [age]   Brendan Tucker use    Drinks occasionally beer   Lives at home with mother Brendan Tucker [phone]), father, sister   Works in Architect as Chief Executive Officer   Lives in a 25 yr old home. Smoking: yes marijuana and 1 ppd x 10 yrs Occupation: Freight forwarder History: Water Damage/mildew in the house: no Charity fundraiser in the family room: no Carpet in the bedroom: no Heating: electric Cooling: window Pet: yes 1 cat x few months  Family History: Family History  Problem Relation Age of Onset  . Depression Mother   . Bipolar disorder Paternal Uncle   . Cancer Other        paternal grandmother with cancer base of tongue   . Heart disease Other         maternal GM  . COPD Other        maternal GM  . ALS Other        maternal GF   Problem                               Relation Asthma                                   No  Eczema                                No  Food allergy                          No  Allergic rhino conjunctivitis     Mother, sister   Review of Systems  Constitutional: Negative for appetite change, chills, fever and unexpected weight change.  HENT: Negative for congestion and rhinorrhea.   Eyes: Negative for itching.  Respiratory: Negative for cough, chest tightness, shortness of breath and wheezing.   Cardiovascular: Negative for chest pain.  Gastrointestinal: Negative for abdominal pain.  Genitourinary: Negative for difficulty urinating.  Skin: Negative for rash.  Allergic/Immunologic: Positive for environmental allergies.  Neurological: Positive for seizures. Negative for headaches.   Objective: BP 90/70 (BP Location: Right Arm, Patient Position: Sitting, Cuff Size: Normal)   Pulse 93   Resp 16   Ht _0  (1.905 m)   Wt 154 lb (69.9 kg)   SpO2  97%   BMI 19.25 kg/m  Body mass index is 19.25 kg/m. Physical Exam  Constitutional: He is oriented to person, place, and time. He appears well-developed and well-nourished.  HENT:  Head: Normocephalic and atraumatic.  Right Ear: External ear normal.  Left Ear: External ear normal.  Nose: Nose normal.  Mouth/Throat: Oropharynx is clear and moist.  Eyes: Conjunctivae and EOM are normal.  Neck: Neck supple.  Cardiovascular: Normal rate, regular rhythm and normal heart sounds. Exam reveals no gallop and no friction rub.  No murmur heard. Pulmonary/Chest: Effort normal and breath sounds normal. He has no wheezes. He has no rales.  Abdominal: Soft.  Neurological: He is alert and oriented to person, place, and time.  Skin: Skin is warm. No rash noted.  Psychiatric: He has a normal mood and affect. His behavior is normal.  Nursing note and vitals  reviewed.  The plan was reviewed with the patient/family, and all questions/concerned were addressed.  It was my pleasure to see Brendan Tucker today and participate in his care. Please feel free to contact me with any questions or concerns.  Sincerely,  Rexene Alberts, DO Allergy & Immunology  [facility] office: [phone] [facility] office: [phone]

## 2018-12-08 NOTE — Assessment & Plan Note (Signed)
   See assessment and plan as above for allergic rhinitis.  

## 2018-12-13 LAB — CBC WITH DIFFERENTIAL
Basophils Absolute: 0.1 10*3/uL (ref 0.0–0.2)
Basos: 1 %
EOS (ABSOLUTE): 0.3 10*3/uL (ref 0.0–0.4)
Eos: 4 %
Hematocrit: 43.8 % (ref 37.5–51.0)
Hemoglobin: 14.7 g/dL (ref 13.0–17.7)
Immature Grans (Abs): 0 10*3/uL (ref 0.0–0.1)
Immature Granulocytes: 0 %
Lymphocytes Absolute: 1.4 10*3/uL (ref 0.7–3.1)
Lymphs: 22 %
MCH: 30.4 pg (ref 26.6–33.0)
MCHC: 33.6 g/dL (ref 31.5–35.7)
MCV: 91 fL (ref 79–97)
Monocytes Absolute: 0.6 10*3/uL (ref 0.1–0.9)
Monocytes: 9 %
Neutrophils Absolute: 4 10*3/uL (ref 1.4–7.0)
Neutrophils: 64 %
RBC: 4.83 x10E6/uL (ref 4.14–5.80)
RDW: 11.6 % (ref 11.6–15.4)
WBC: 6.3 10*3/uL (ref 3.4–10.8)

## 2018-12-13 LAB — COMPREHENSIVE METABOLIC PANEL
ALT: 13 IU/L (ref 0–44)
AST: 24 IU/L (ref 0–40)
Albumin/Globulin Ratio: 2.6 — ABNORMAL HIGH (ref 1.2–2.2)
Albumin: 5.2 g/dL (ref 4.1–5.2)
Alkaline Phosphatase: 73 IU/L (ref 39–117)
BUN/Creatinine Ratio: 13 (ref 9–20)
BUN: 11 mg/dL (ref 6–20)
Bilirubin Total: 1 mg/dL (ref 0.0–1.2)
CO2: 23 mmol/L (ref 20–29)
Calcium: 9.8 mg/dL (ref 8.7–10.2)
Chloride: 104 mmol/L (ref 96–106)
Creatinine, Ser: 0.83 mg/dL (ref 0.76–1.27)
GFR calc Af Amer: 142 mL/min/{1.73_m2} (ref >59)
GFR calc non Af Amer: 123 mL/min/{1.73_m2} (ref >59)
Globulin, Total: 2 g/dL (ref 1.5–4.5)
Glucose: 78 mg/dL (ref 65–99)
Potassium: 4.4 mmol/L (ref 3.5–5.2)
Sodium: 144 mmol/L (ref 134–144)
Total Protein: 7.2 g/dL (ref 6.0–8.5)

## 2018-12-13 LAB — PEANUT COMPONENTS
F352-IgE Ara h 8: <0.1 kU/L
F422-IgE Ara h 1: <0.1 kU/L
F423-IgE Ara h 2: <0.1 kU/L
F424-IgE Ara h 3: <0.1 kU/L
F427-IgE Ara h 9: 11.6 kU/L — AB
F447-IgE Ara h 6: <0.1 kU/L

## 2018-12-13 LAB — SEDIMENTATION RATE: Sed Rate: 2 mm/hr (ref 0–15)

## 2018-12-13 LAB — ALPHA-GAL PANEL
Alpha Gal IgE*: 0.29 kU/L — ABNORMAL HIGH (ref <0.10)
Beef (Bos spp) IgE: <0.1 kU/L (ref <0.35)
Class Interpretation: 0
Class Interpretation: 0
Class Interpretation: 0
Lamb/Mutton (Ovis spp) IgE: <0.1 kU/L (ref <0.35)
Pork (Sus spp) IgE: <0.1 kU/L (ref <0.35)

## 2018-12-13 LAB — C3 AND C4
Complement C3, Serum: 85 mg/dL (ref 82–167)
Complement C4, Serum: 20 mg/dL (ref 14–44)

## 2018-12-13 LAB — ALLERGEN HYMENOPTERA PANEL
Bumblebee: <0.1 kU/L
Honeybee IgE: <0.1 kU/L
Hornet, White Face, IgE: 0.18 kU/L — AB
Hornet, Yellow, IgE: <0.1 kU/L
Paper Wasp IgE: 1.08 kU/L — AB
Yellow Jacket, IgE: 0.32 kU/L — AB

## 2018-12-13 LAB — ANA W/REFLEX: Anti Nuclear Antibody (ANA): NEGATIVE

## 2018-12-13 LAB — ALLERGEN SOYBEAN: Soybean IgE: 0.7 kU/L — AB

## 2018-12-13 LAB — ALLERGEN SESAME F10: Sesame Seed IgE: 1.85 kU/L — AB

## 2018-12-13 LAB — IGE PEANUT W/COMPONENT REFLEX: Peanut, IgE: 3.2 kU/L — AB

## 2018-12-13 LAB — THYROID CASCADE PROFILE: TSH: 0.494 u[IU]/mL (ref 0.450–4.500)

## 2018-12-13 LAB — ALLERGEN PEACH F95: Allergen, Peach f95: 9.48 kU/L — AB

## 2018-12-13 LAB — TRYPTASE: Tryptase: 4 ug/L (ref 2.2–13.2)

## 2018-12-13 LAB — ALLERGEN COMPONENT COMMENTS

## 2019-01-23 IMAGING — DX DG HAND COMPLETE 3+V*R*
3 series · 3 of 3 positions shown · non-contrast
Comparison: 01/07/2011

CLINICAL DATA: Patient states that he punched a refrigerator last
night with his right hand, pain along 6nd-6th metacarpals that
radiates into his wrist. No Hx

EXAM:
RIGHT HAND - COMPLETE 3+ VIEW

[hand pa]
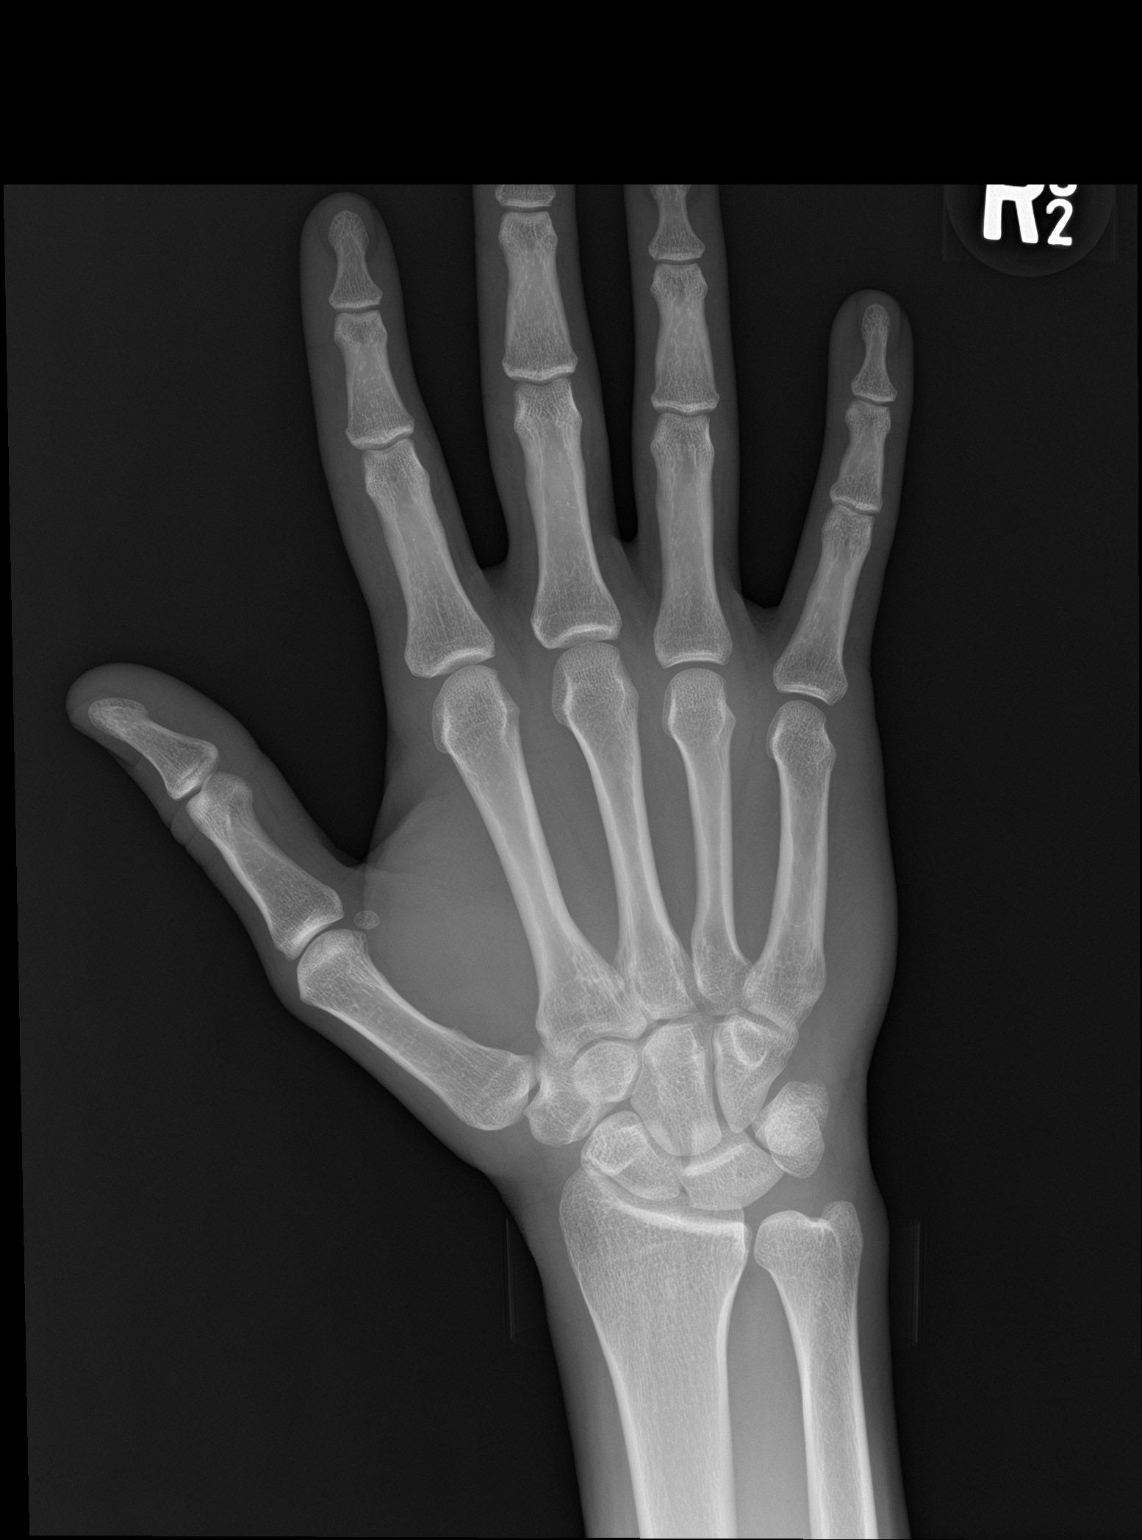

[hand obl]
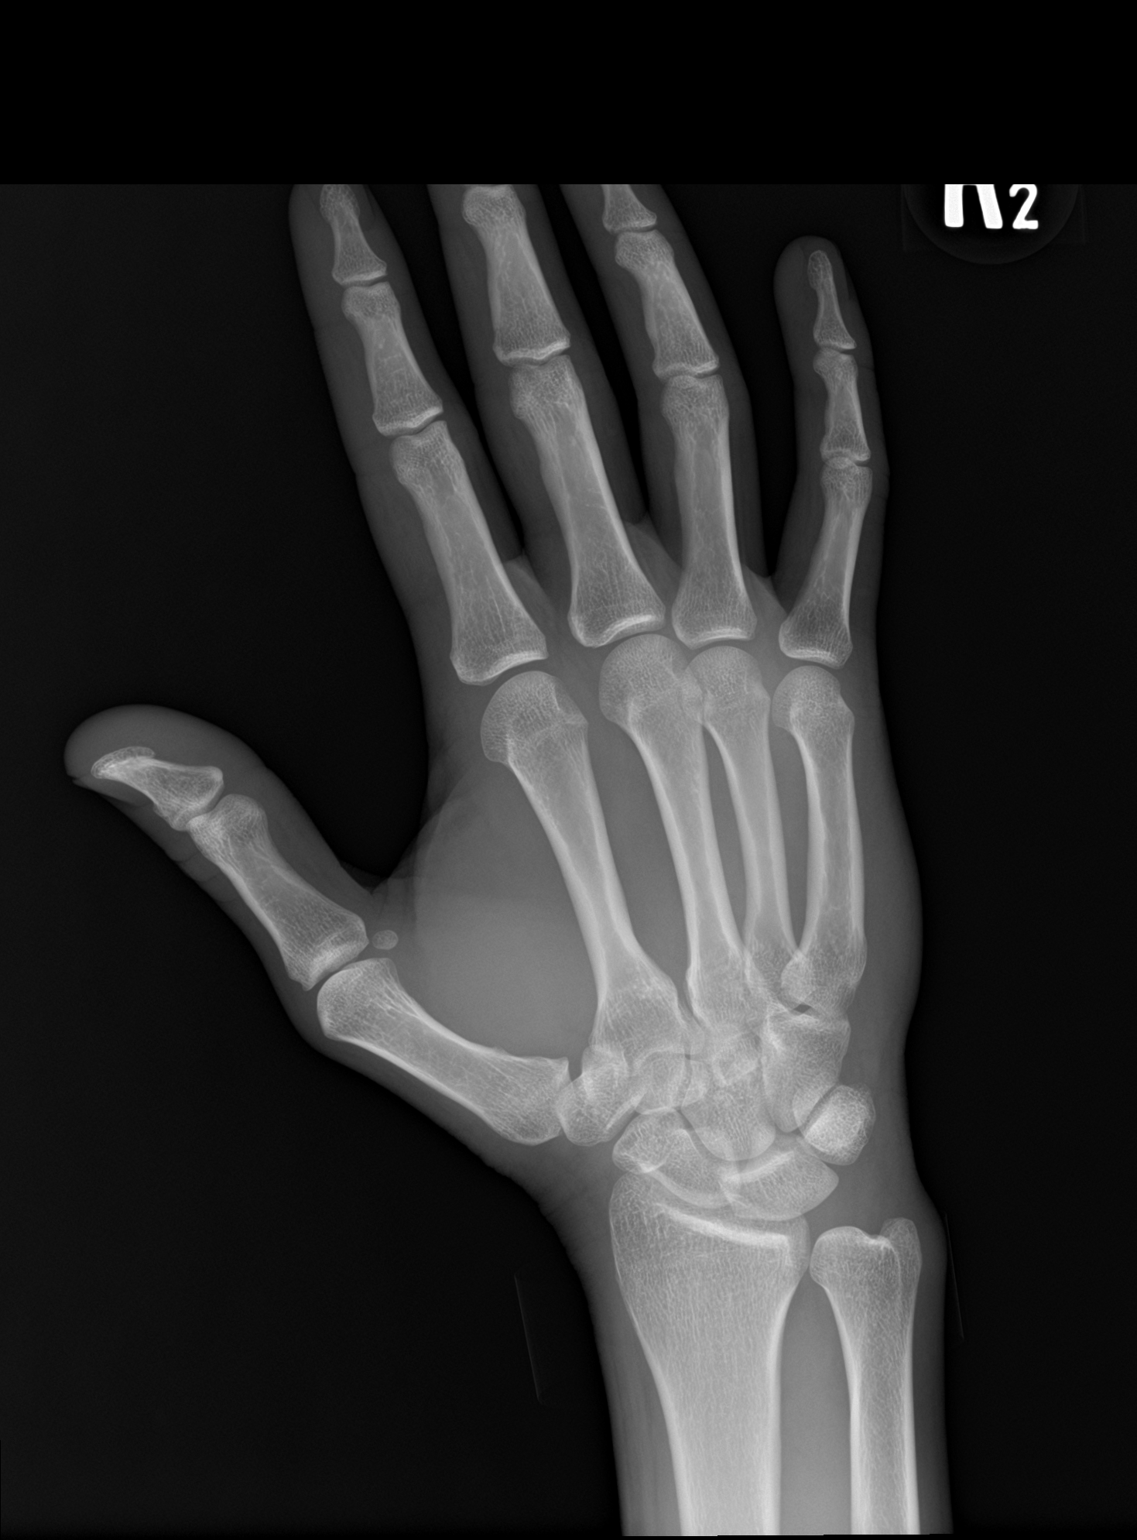

[hand lat]
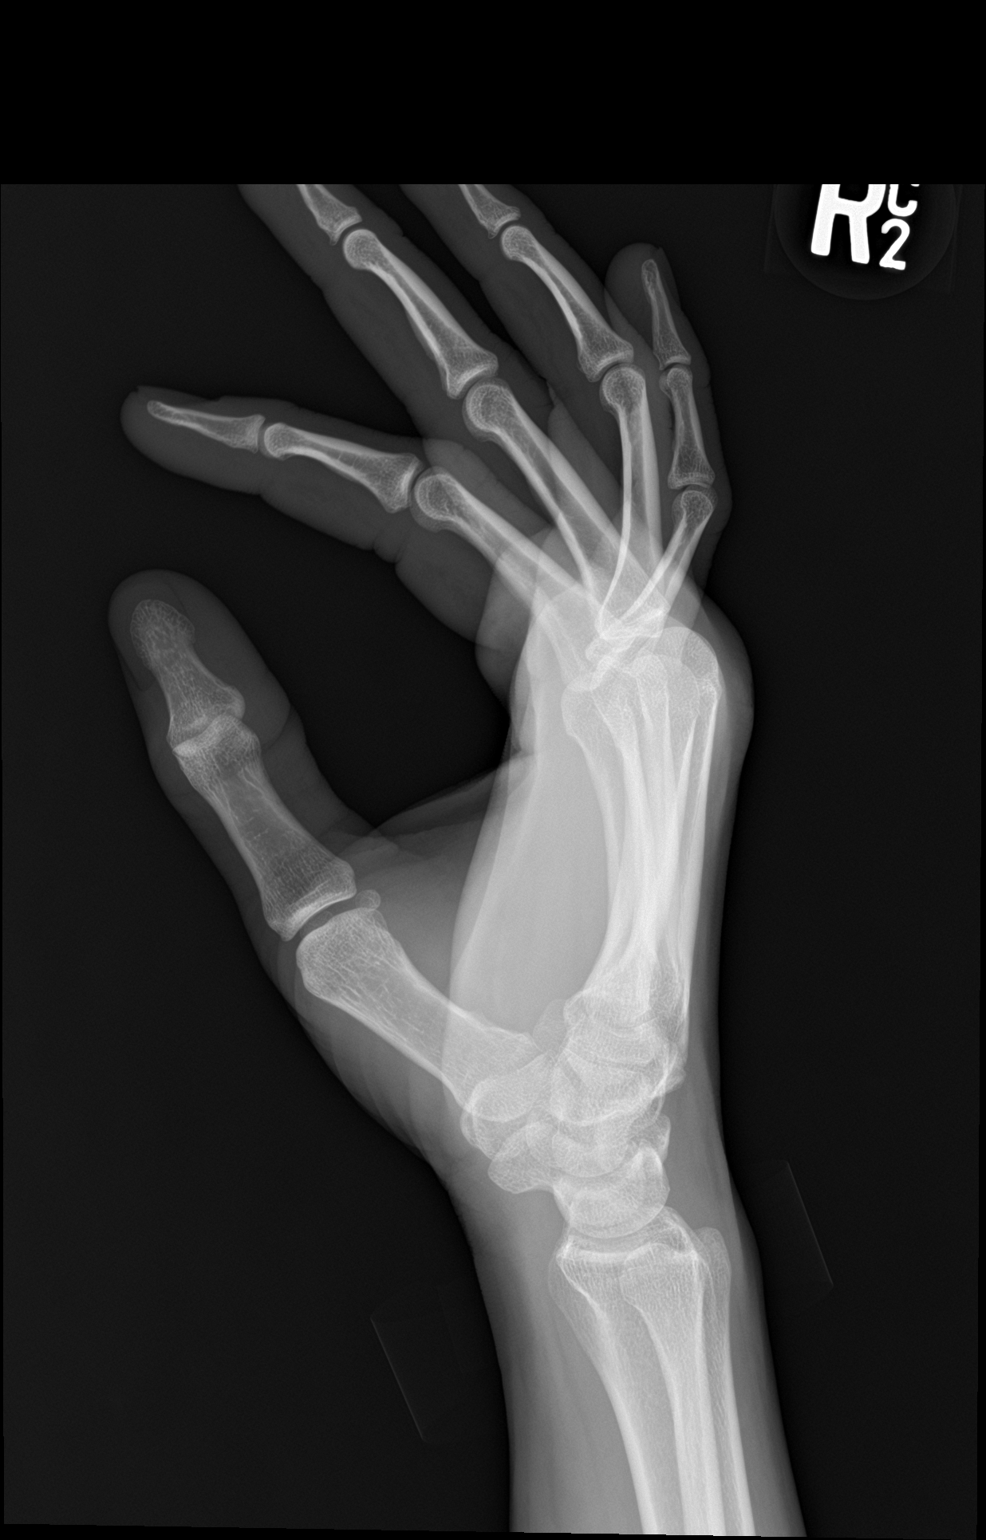

[3 of 3 positions shown; findings below may reference images not displayed]

FINDINGS: There is no evidence of fracture or dislocation. There is no
evidence of arthropathy or other focal bone abnormality. Soft
tissues are unremarkable.
IMPRESSION: Negative.

## 2019-02-08 ENCOUNTER — Ambulatory Visit: Payer: Self-pay | Admitting: Allergy

## 2019-02-08 NOTE — Progress Notes (Deleted)
Follow Up Note  RE: Brendan Tucker MRN: [id number] DOB: [birth year] Date of Office Visit: 02/08/2019  Referring provider: Ileana LaddWong, Francis P, MD Primary care provider: Ileana LaddWong, Francis P, MD  Chief Complaint: No chief complaint on file.  History of Present Illness: I had the pleasure of seeing Brendan Tucker for a follow up visit at the [facility] on 02/08/2019. He is a 23 y.o. male, who is being followed for allergic reaction, SOB, allergic rhino conjunctivitis. Today he is here for ***regular follow up visit. *** new complaint of ***. He is accompanied today by his *** who provided/contributed to the history. His previous allergy office visit was on 12/08/2018 with Dr. Selena BattenKim.   Allergic reaction 2 episodes of allergic reactions.  First episode occurred 1 year ago after smoking marijuana in a peach flavored cigar wrap.  Patient had loss of consciousness and had to be taken to the ER via ambulance where he was hospitalized for 3 days.  He had this kind of marijuana before with no issues.  Still smoking marijuana on a daily basis for his "seizures" with no additional reactions. He also smokes regular cigarettes 1ppd. Second episode occurred after being outdoors for prolonged period of time 1 month ago. He had whole body hives and trouble breathing. Self injected epinephrine and took benadryl. Symptoms improved within 30 minutes. Denies any insect/bee stings. No changes in diet, medications or personal care products.   Today's skin testing showed: Positive to grass, weed, ragweed, trees, tobacco, cockroach, fire ant. Positive to peanut, soy, sesame.   Patient eats peanuts, soy and sesame with no issues. Recommend avoidance for now and if bloodwork negative will discuss reintroduction at next visit.   Based on clinical history and test results concerned about potential fire ant causing the anaphylaxis as both episodes occurred during the warmer months.  Get bloodwork as below to  further investigate this.   Do not use any peach flavored cigar wrapper.  I have prescribed epinephrine injectable and demonstrated proper use. For mild symptoms you can take over the counter antihistamines such as Benadryl and monitor symptoms closely. If symptoms worsen or if you have severe symptoms including breathing issues, throat closure, significant swelling, whole body hives, severe diarrhea and vomiting, lightheadedness then inject epinephrine and seek immediate medical care afterwards.  Emergency action plan given.   Consider allergy injections for fire ant.   Shortness of breath Current tobacco smoker with episodes of SOB during these anaphylactic reactions. No prior history of asthma/copd diagnosis.  Today's spirometry was normal.  Monitor symptoms.  Discussed smoking cessation.   Other allergic rhinitis Rhinoconjunctivitis symptoms mainly during the springtime.  Currently on Allegra 180 mg daily with good benefit.  Today skin testing was positive to grass, weed, ragweed, trees, tobacco, cockroach, fire ant.  Start environmental control measures.   Continue allegra 180mg  daily.  May use Flonase 1-2 sprays daily if needed.  May use Patanol 1 drop in each eye daily as needed for itchy/watery eyes.   Allergic conjunctivitis of both eyes  See assessment and plan as above for allergic rhinitis.  Assessment and Plan: Brendan Tucker is a 23 y.o. male with: No problem-specific Assessment & Plan notes found for this encounter.  No follow-ups on file.  No orders of the defined types were placed in this encounter.  Lab Orders  No laboratory test(s) ordered today    Diagnostics: Spirometry:  Tracings reviewed. His effort: {Blank single:19197::"Good reproducible efforts.","It was hard to get consistent  efforts and there is a question as to whether this reflects a maximal maneuver.","Poor effort, data can not be interpreted."} FVC: ***L FEV1: ***L, ***% predicted  FEV1/FVC ratio: ***% Interpretation: {Blank single:19197::"Spirometry consistent with mild obstructive disease","Spirometry consistent with moderate obstructive disease","Spirometry consistent with severe obstructive disease","Spirometry consistent with possible restrictive disease","Spirometry consistent with mixed obstructive and restrictive disease","Spirometry uninterpretable due to technique","Spirometry consistent with normal pattern","No overt abnormalities noted given today's efforts"}.  Please see scanned spirometry results for details.  Skin Testing: {Blank single:19197::"Select foods","Environmental allergy panel","Environmental allergy panel and select foods","Food allergy panel","None","Deferred due to recent antihistamines use"}. Positive test to: ***. Negative test to: ***.  Results discussed with patient/family.   Medication List:  Current Outpatient Medications  Medication Sig Dispense Refill  . EPINEPHrine 0.3 mg/0.3 mL IJ SOAJ injection See admin instructions.    . Fexofenadine HCl (ALLEGRA ALLERGY PO) Take by mouth.    . fluticasone (FLONASE) 50 MCG/ACT nasal spray Use 1-2 sprays daily as needed. 16 g 5  . montelukast (SINGULAIR) 10 MG tablet     . Olopatadine HCl 0.2 % SOLN Apply 1 drop to eye daily as needed (itchy/watery eyes). 1 Bottle 5   No current facility-administered medications for this visit.    Allergies: Allergies  Allergen Reactions  . Ativan [Lorazepam] Other (See Comments)    Causes agitation and aggressive  . Ketamine Other (See Comments)    hallucinations  . Lamictal [Lamotrigine]     Stomatitis, lip/facial swelling   . Biaxin [Clarithromycin] Nausea And Vomiting  . Sulfa Antibiotics Rash   I reviewed his past medical history, social history, family history, and environmental history and no significant changes have been reported from previous visit on 12/08/2018.  Review of Systems  Constitutional: Negative for appetite change, chills, fever  and unexpected weight change.  HENT: Negative for congestion and rhinorrhea.   Eyes: Negative for itching.  Respiratory: Negative for cough, chest tightness, shortness of breath and wheezing.   Cardiovascular: Negative for chest pain.  Gastrointestinal: Negative for abdominal pain.  Genitourinary: Negative for difficulty urinating.  Skin: Negative for rash.  Allergic/Immunologic: Positive for environmental allergies.  Neurological: Positive for seizures. Negative for headaches.   Objective: There were no vitals taken for this visit. There is no height or weight on file to calculate BMI. Physical Exam  Constitutional: He is oriented to person, place, and time. He appears well-developed and well-nourished.  HENT:  Head: Normocephalic and atraumatic.  Right Ear: External ear normal.  Left Ear: External ear normal.  Nose: Nose normal.  Mouth/Throat: Oropharynx is clear and moist.  Eyes: Conjunctivae and EOM are normal.  Neck: Neck supple.  Cardiovascular: Normal rate, regular rhythm and normal heart sounds. Exam reveals no gallop and no friction rub.  No murmur heard. Pulmonary/Chest: Effort normal and breath sounds normal. He has no wheezes. He has no rales.  Abdominal: Soft.  Neurological: He is alert and oriented to person, place, and time.  Skin: Skin is warm. No rash noted.  Psychiatric: He has a normal mood and affect. His behavior is normal.  Nursing note and vitals reviewed.  Previous notes and tests were reviewed. The plan was reviewed with the patient/family, and all questions/concerned were addressed.  It was my pleasure to see Brendan Tucker today and participate in his care. Please feel free to contact me with any questions or concerns.  Sincerely,  Rexene Alberts, DO Allergy & Immunology  [facility] office: [phone] [facility] office: [phone]

## 2019-05-11 DEATH — deceased
